# Patient Record
Sex: Female | Born: 1970
Health system: Southern US, Community
[De-identification: ages and names within clinical notes are randomized; demographics above are authoritative.]

## PROBLEM LIST (undated history)

## (undated) DIAGNOSIS — Z8719 Personal history of other diseases of the digestive system: Secondary | ICD-10-CM

## (undated) DIAGNOSIS — Z8742 Personal history of other diseases of the female genital tract: Secondary | ICD-10-CM

## (undated) DIAGNOSIS — N9489 Other specified conditions associated with female genital organs and menstrual cycle: Secondary | ICD-10-CM

## (undated) DIAGNOSIS — F419 Anxiety disorder, unspecified: Secondary | ICD-10-CM

## (undated) DIAGNOSIS — Z8041 Family history of malignant neoplasm of ovary: Secondary | ICD-10-CM

## (undated) DIAGNOSIS — D649 Anemia, unspecified: Secondary | ICD-10-CM

## (undated) DIAGNOSIS — Z803 Family history of malignant neoplasm of breast: Secondary | ICD-10-CM

## (undated) DIAGNOSIS — N739 Female pelvic inflammatory disease, unspecified: Secondary | ICD-10-CM

## (undated) DIAGNOSIS — J302 Other seasonal allergic rhinitis: Secondary | ICD-10-CM

## (undated) DIAGNOSIS — Z800 Family history of malignant neoplasm of digestive organs: Secondary | ICD-10-CM

## (undated) HISTORY — PX: TUBAL LIGATION: SHX77

## (undated) HISTORY — DX: Anxiety disorder, unspecified: F41.9

## (undated) HISTORY — PX: CHOLECYSTECTOMY: SHX55

## (undated) HISTORY — DX: Family history of malignant neoplasm of ovary: Z80.41

## (undated) HISTORY — DX: Family history of malignant neoplasm of breast: Z80.3

## (undated) HISTORY — DX: Family history of malignant neoplasm of digestive organs: Z80.0

## (undated) HISTORY — DX: Personal history of other diseases of the female genital tract: Z87.42

---

## 2011-11-25 HISTORY — PX: ABLATION: SHX5711

## 2014-03-22 ENCOUNTER — Emergency Department
Admission: EM | Admit: 2014-03-22 | Discharge: 2014-03-22 | Disposition: A | Discharge status: Home / Self Care | Payer: BC Managed Care – PPO | Source: Home / Self Care | Attending: Physician Assistant | Admitting: Physician Assistant

## 2014-03-22 ENCOUNTER — Encounter: Payer: Self-pay | Admitting: Emergency Medicine

## 2014-03-22 DIAGNOSIS — J0100 Acute maxillary sinusitis, unspecified: Secondary | ICD-10-CM

## 2014-03-22 DIAGNOSIS — H65192 Other acute nonsuppurative otitis media, left ear: Secondary | ICD-10-CM

## 2014-03-22 HISTORY — DX: Other seasonal allergic rhinitis: J30.2

## 2014-03-22 MED ORDER — AMOXICILLIN-POT CLAVULANATE 875-125 MG PO TABS: 1.0000 | ORAL_TABLET | Freq: Two times a day (BID) | ORAL | Status: DC

## 2014-03-22 NOTE — ED Notes (Signed)
Pt c/o sinus pressure, bilateral ear ache, and nasal congestion x 2 days. Denies fever. She has taken Sudafed, Nyquil and Dayquil.

## 2014-03-22 NOTE — ED Provider Notes (Signed)
CSN: [id number]     Arrival date & time 03/22/14  1209 History   First MD Initiated Contact with Patient 03/22/14 1233     Chief Complaint  Patient presents with  . Nasal Congestion   (Consider location/radiation/quality/duration/timing/severity/associated sxs/prior Treatment) HPI Pt is a 43 yo female who presents to the clinic with sinus pressure, bilateral ear pain left greater than right, nasal congestion for last 2 days. Denies fever or chills. Taken sudafed, dayquil and nyquil with little relief. No other sick contacts.   Past Medical History  Diagnosis Date  . Seasonal allergies    Past Surgical History  Procedure Laterality Date  . Cesarean section    . Cholecystectomy     Family History  Problem Relation Age of Onset  . Cancer Mother     colon  . Heart disease Father    History  Substance Use Topics  . Smoking status: Never Smoker   . Smokeless tobacco: Not on file  . Alcohol Use: Yes   OB History   Grav Para Term Preterm Abortions TAB SAB Ect Mult Living                 Review of Systems  All other systems reviewed and are negative.   Allergies  Review of patient's allergies indicates no known allergies.  Home Medications   Prior to Admission medications   Medication Sig Start Date End Date Taking? Authorizing Provider  fexofenadine (ALLEGRA) 180 MG tablet Take 180 mg by mouth daily.   Yes Historical Provider, MD  amoxicillin-clavulanate (AUGMENTIN) 875-125 MG per tablet Take 1 tablet by mouth 2 (two) times daily. For 10 days. 03/22/14   Refugia Laneve L Evans Levee, PA-C   BP 125/85  Pulse 98  Temp(Src) 97.5 F (36.4 C) (Oral)  Resp 18  Ht 5\' 3"  (1.6 m)  Wt 168 lb (76.204 kg)  BMI 29.77 kg/m2  SpO2 100%  LMP 03/19/2014 Physical Exam  Constitutional: She appears well-developed and well-nourished.  HENT:  Head: Normocephalic and atraumatic.  Nose: Nose normal.  Mouth/Throat: Oropharynx is clear and moist.  Right TM erythematous but able to see TM with  light reflex.  Right TM buldging, erythematous TM. Not able to view ossicles or light reflex.   Maxillary tenderness to palpation over sinuses.   Bilateral nasal turbinates red and swollen with rhinorrhea.   Eyes: Conjunctivae are normal. Right eye exhibits no discharge. Left eye exhibits no discharge.  Neck: Normal range of motion. Neck supple.  Bilateral tender anterior cervical adenopathy.   Cardiovascular: Normal rate, regular rhythm and normal heart sounds.   Pulmonary/Chest: Effort normal and breath sounds normal. She has no wheezes.  Lymphadenopathy:    She has cervical adenopathy.  Psychiatric: She has a normal mood and affect. Her behavior is normal.    ED Course  Procedures (including critical care time) Labs Review Labs Reviewed - No data to display  Imaging Review No results found.   MDM   1. Acute nonsuppurative otitis media of left ear   2. Acute maxillary sinusitis, recurrence not specified    Treated with Augmentin for 10 days.  HO given for symptomatic care.  Discussed to continue mucinex and consider adding flonase.  Follow up if not improving or worsening.     Donella Stade, PA-C 03/22/14 1240

## 2014-03-22 NOTE — Discharge Instructions (Signed)
Sinusitis Sinusitis is redness, soreness, and inflammation of the paranasal sinuses. Paranasal sinuses are air pockets within the bones of your face (beneath the eyes, the middle of the forehead, or above the eyes). In healthy paranasal sinuses, mucus is able to drain out, and air is able to circulate through them by way of your nose. However, when your paranasal sinuses are inflamed, mucus and air can become trapped. This can allow bacteria and other germs to grow and cause infection. Sinusitis can develop quickly and last only a short time (acute) or continue over a long period (chronic). Sinusitis that lasts for more than 12 weeks is considered chronic.  CAUSES  Causes of sinusitis include:  Allergies.  Structural abnormalities, such as displacement of the cartilage that separates your nostrils (deviated septum), which can decrease the air flow through your nose and sinuses and affect sinus drainage.  Functional abnormalities, such as when the small hairs (cilia) that line your sinuses and help remove mucus do not work properly or are not present. SIGNS AND SYMPTOMS  Symptoms of acute and chronic sinusitis are the same. The primary symptoms are pain and pressure around the affected sinuses. Other symptoms include:  Upper toothache.  Earache.  Headache.  Bad breath.  Decreased sense of smell and taste.  A cough, which worsens when you are lying flat.  Fatigue.  Fever.  Thick drainage from your nose, which often is green and may contain pus (purulent).  Swelling and warmth over the affected sinuses. DIAGNOSIS  Your health care provider will perform a physical exam. During the exam, your health care provider may:  Look in your nose for signs of abnormal growths in your nostrils (nasal polyps).  Tap over the affected sinus to check for signs of infection.  View the inside of your sinuses (endoscopy) using an imaging device that has a light attached (endoscope). If your health  care provider suspects that you have chronic sinusitis, one or more of the following tests may be recommended:  Allergy tests.  Nasal culture. A sample of mucus is taken from your nose, sent to a lab, and screened for bacteria.  Nasal cytology. A sample of mucus is taken from your nose and examined by your health care provider to determine if your sinusitis is related to an allergy. TREATMENT  Most cases of acute sinusitis are related to a viral infection and will resolve on their own within 10 days. Sometimes medicines are prescribed to help relieve symptoms (pain medicine, decongestants, nasal steroid sprays, or saline sprays).  However, for sinusitis related to a bacterial infection, your health care provider will prescribe antibiotic medicines. These are medicines that will help kill the bacteria causing the infection.  Rarely, sinusitis is caused by a fungal infection. In theses cases, your health care provider will prescribe antifungal medicine. For some cases of chronic sinusitis, surgery is needed. Generally, these are cases in which sinusitis recurs more than 3 times per year, despite other treatments. HOME CARE INSTRUCTIONS   Drink plenty of water. Water helps thin the mucus so your sinuses can drain more easily.  Use a humidifier.  Inhale steam 3 to 4 times a day (for example, sit in the bathroom with the shower running).  Apply a warm, moist washcloth to your face 3 to 4 times a day, or as directed by your health care provider.  Use saline nasal sprays to help moisten and clean your sinuses.  Take medicines only as directed by your health care provider.    If you were prescribed either an antibiotic or antifungal medicine, finish it all even if you start to feel better. SEEK IMMEDIATE MEDICAL CARE IF:  You have increasing pain or severe headaches.  You have nausea, vomiting, or drowsiness.  You have swelling around your face.  You have vision problems.  You have a stiff  neck.  You have difficulty breathing. MAKE SURE YOU:   Understand these instructions.  Will watch your condition.  Will get help right away if you are not doing well or get worse. Document Released: 05/13/2005 Document Revised: 09/27/2013 Document Reviewed: 05/28/2011 [facility] Patient Information 2015 [place], [place]. This information is not intended to replace advice given to you by your health care provider. Make sure you discuss any questions you have with your health care provider. Otitis Media Otitis media is redness, soreness, and inflammation of the middle ear. Otitis media may be caused by allergies or, most commonly, by infection. Often it occurs as a complication of the common cold. SIGNS AND SYMPTOMS Symptoms of otitis media may include:  Earache.  Fever.  Ringing in your ear.  Headache.  Leakage of fluid from the ear. DIAGNOSIS To diagnose otitis media, your health care provider will examine your ear with an otoscope. This is an instrument that allows your health care provider to see into your ear in order to examine your eardrum. Your health care provider also will ask you questions about your symptoms. TREATMENT  Typically, otitis media resolves on its own within 3-5 days. Your health care provider may prescribe medicine to ease your symptoms of pain. If otitis media does not resolve within 5 days or is recurrent, your health care provider may prescribe antibiotic medicines if he or she suspects that a bacterial infection is the cause. HOME CARE INSTRUCTIONS   If you were prescribed an antibiotic medicine, finish it all even if you start to feel better.  Take medicines only as directed by your health care provider.  Keep all follow-up visits as directed by your health care provider. SEEK MEDICAL CARE IF:  You have otitis media only in one ear, or bleeding from your nose, or both.  You notice a lump on your neck.  You are not getting better in 3-5 days.  You  feel worse instead of better. SEEK IMMEDIATE MEDICAL CARE IF:   You have pain that is not controlled with medicine.  You have swelling, redness, or pain around your ear or stiffness in your neck.  You notice that part of your face is paralyzed.  You notice that the bone behind your ear (mastoid) is tender when you touch it. MAKE SURE YOU:   Understand these instructions.  Will watch your condition.  Will get help right away if you are not doing well or get worse. Document Released: 02/16/2004 Document Revised: 09/27/2013 Document Reviewed: 12/08/2012 [facility] Patient Information 2015 [place], [place]. This information is not intended to replace advice given to you by your health care provider. Make sure you discuss any questions you have with your health care provider.

## 2014-03-27 ENCOUNTER — Telehealth: Payer: Self-pay | Admitting: *Deleted

## 2014-03-27 NOTE — ED Notes (Signed)
Pt called and stated she is still feeling bad and only slightly better after being on antibiotic for 6 days.

## 2014-05-18 ENCOUNTER — Ambulatory Visit (INDEPENDENT_AMBULATORY_CARE_PROVIDER_SITE_OTHER): Payer: Managed Care, Other (non HMO) | Admitting: Internal Medicine

## 2014-05-18 ENCOUNTER — Encounter: Payer: Self-pay | Admitting: Internal Medicine

## 2014-05-18 ENCOUNTER — Encounter: Payer: Self-pay | Admitting: *Deleted

## 2014-05-18 VITALS — BP 121/82 | HR 75 | Resp 16 | Ht 62.5 in | Wt 170.0 lb

## 2014-05-18 DIAGNOSIS — B977 Papillomavirus as the cause of diseases classified elsewhere: Secondary | ICD-10-CM

## 2014-05-18 DIAGNOSIS — Z8041 Family history of malignant neoplasm of ovary: Secondary | ICD-10-CM

## 2014-05-18 DIAGNOSIS — IMO0002 Reserved for concepts with insufficient information to code with codable children: Secondary | ICD-10-CM | POA: Insufficient documentation

## 2014-05-18 DIAGNOSIS — Z800 Family history of malignant neoplasm of digestive organs: Secondary | ICD-10-CM | POA: Insufficient documentation

## 2014-05-18 DIAGNOSIS — F4322 Adjustment disorder with anxiety: Secondary | ICD-10-CM

## 2014-05-18 DIAGNOSIS — F419 Anxiety disorder, unspecified: Secondary | ICD-10-CM

## 2014-05-18 DIAGNOSIS — R888 Abnormal findings in other body fluids and substances: Secondary | ICD-10-CM

## 2014-05-18 DIAGNOSIS — J309 Allergic rhinitis, unspecified: Secondary | ICD-10-CM | POA: Insufficient documentation

## 2014-05-18 DIAGNOSIS — J3089 Other allergic rhinitis: Secondary | ICD-10-CM

## 2014-05-18 DIAGNOSIS — G4700 Insomnia, unspecified: Secondary | ICD-10-CM | POA: Insufficient documentation

## 2014-05-18 DIAGNOSIS — Z803 Family history of malignant neoplasm of breast: Secondary | ICD-10-CM

## 2014-05-18 LAB — COMPREHENSIVE METABOLIC PANEL
ALT: 23 U/L (ref 0–35)
AST: 16 U/L (ref 0–37)
Albumin: 4.5 g/dL (ref 3.5–5.2)
Alkaline Phosphatase: 64 U/L (ref 39–117)
BUN: 10 mg/dL (ref 6–23)
CO2: 26 mEq/L (ref 19–32)
Calcium: 9.2 mg/dL (ref 8.4–10.5)
Chloride: 100 mEq/L (ref 96–112)
Creat: 0.64 mg/dL (ref 0.50–1.10)
Glucose, Bld: 94 mg/dL (ref 70–99)
Potassium: 4.4 mEq/L (ref 3.5–5.3)
Sodium: 137 mEq/L (ref 135–145)
Total Bilirubin: 0.5 mg/dL (ref 0.2–1.2)
Total Protein: 7.3 g/dL (ref 6.0–8.3)

## 2014-05-18 LAB — LIPID PANEL
Cholesterol: 207 mg/dL — ABNORMAL HIGH (ref 0–200)
HDL: 57 mg/dL (ref >39)
LDL Cholesterol: 131 mg/dL — ABNORMAL HIGH (ref 0–99)
Total CHOL/HDL Ratio: 3.6 Ratio
Triglycerides: 96 mg/dL (ref <150)
VLDL: 19 mg/dL (ref 0–40)

## 2014-05-18 LAB — CBC WITH DIFFERENTIAL/PLATELET
Basophils Absolute: 0 10*3/uL (ref 0.0–0.1)
Basophils Relative: 0 % (ref 0–1)
Eosinophils Absolute: 0.1 10*3/uL (ref 0.0–0.7)
Eosinophils Relative: 1 % (ref 0–5)
HCT: 43 % (ref 36.0–46.0)
Hemoglobin: 14.6 g/dL (ref 12.0–15.0)
Lymphocytes Relative: 30 % (ref 12–46)
Lymphs Abs: 2.2 10*3/uL (ref 0.7–4.0)
MCH: 27.9 pg (ref 26.0–34.0)
MCHC: 34 g/dL (ref 30.0–36.0)
MCV: 82.2 fL (ref 78.0–100.0)
MPV: 9.6 fL (ref 9.4–12.4)
Monocytes Absolute: 0.4 10*3/uL (ref 0.1–1.0)
Monocytes Relative: 6 % (ref 3–12)
Neutro Abs: 4.5 10*3/uL (ref 1.7–7.7)
Neutrophils Relative %: 63 % (ref 43–77)
Platelets: 251 10*3/uL (ref 150–400)
RBC: 5.23 MIL/uL — ABNORMAL HIGH (ref 3.87–5.11)
RDW: 13.4 % (ref 11.5–15.5)
WBC: 7.2 10*3/uL (ref 4.0–10.5)

## 2014-05-18 MED ORDER — TRAZODONE HCL 100 MG PO TABS: ORAL_TABLET | ORAL | Status: DC

## 2014-05-18 NOTE — Progress Notes (Signed)
   Subjective:    Patient ID: Megan Gibson, female    DOB: 05/15/1971, 43 y.o.   MRN: [id number]  HPI  Megan Gibson is a new pt here for first visit .   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  Allergic rhinitis, and intermittant vertigo that she tells me is stress induced  No vertigo now   Strong FH of premenopausal breast CA age 63 in mother. Ovarian cancer mother age 1 and colon cancer in mother.  No other gyn CA's .    Megan Gibson reports increasing anxiety and insomnia.   Biggest stress right now is her work.  Hard to shut off work worries   Has difficulty sleeping dur to work issues   Review of Systems See HPI    Objective:   Physical Exam  Physical Exam  Nursing note and vitals reviewed.  Constitutional: She is oriented to person, place, and time. She appears well-developed and well-nourished.  HENT:  Head: Normocephalic and atraumatic.  Cardiovascular: Normal rate and regular rhythm. Exam reveals no gallop and no friction rub.  No murmur heard.  Pulmonary/Chest: Breath sounds normal. She has no wheezes. She has no rales.  Neurological: She is alert and oriented to person, place, and time.  Skin: Skin is warm and dry.  Psychiatric: She has a normal mood and affect. Her behavior is normal.            Assessment & Plan:  Insomnia  Related to anxiety  Will give Trazodone 50mg  for one week then 100 mg nightly  Anxiety   Does not want anything other than trazodone now.  Declines therapist now  Strong FH premenopausal breast CA in mother , ovarian ca and colon   Suspect genetic cancer syndrome  Will refer to genetic counselor.  She has had 2 mm (neg) and had colonoscopy Dr. Alonza Gibson  HPV pos  Managed by GYN  Allergic rhinitis    To lab today  See me 4-6 weeks schedule CPE

## 2014-05-18 NOTE — Patient Instructions (Signed)
See me 5-6 weeks    Schedule CPe

## 2014-05-19 LAB — VITAMIN D 25 HYDROXY (VIT D DEFICIENCY, FRACTURES): Vit D, 25-Hydroxy: 15 ng/mL — ABNORMAL LOW (ref 30–100)

## 2014-05-19 LAB — TSH: TSH: 0.769 u[IU]/mL (ref 0.350–4.500)

## 2014-05-25 ENCOUNTER — Telehealth: Payer: Self-pay | Admitting: *Deleted

## 2014-05-25 NOTE — Telephone Encounter (Signed)
Received referral from Dr. Sharin Mons office for genetics.  Called pt and confirmed 06/01/14 genetic appt w/ her.  Emailed Dr. Coralyn Mark to make her aware.

## 2014-05-29 ENCOUNTER — Other Ambulatory Visit: Payer: Self-pay | Admitting: Internal Medicine

## 2014-05-29 MED ORDER — VITAMIN D (ERGOCALCIFEROL) 1.25 MG (50000 UNIT) PO CAPS: 50000.0000 [IU] | ORAL_CAPSULE | ORAL | Status: DC

## 2014-05-30 ENCOUNTER — Telehealth: Payer: Self-pay

## 2014-05-30 ENCOUNTER — Telehealth: Payer: Self-pay | Admitting: *Deleted

## 2014-05-30 NOTE — Telephone Encounter (Signed)
Megan Gibson [phone]  Megan Gibson returned your call

## 2014-05-30 NOTE — Telephone Encounter (Signed)
I spoke with Megan Gibson for and gave her lab results

## 2014-05-30 NOTE — Telephone Encounter (Signed)
-----   Message from Lanice Shirts, MD sent at 05/29/2014  4:23 PM EST ----- Call pt and let her know that her vitamin D is very low  I e-scribed 50,000 units of vitamin D to take once a week for 12 weeks then [phone] units over the counter daily   Ok to mail to her   Rx sent downstairs

## 2014-05-30 NOTE — Telephone Encounter (Signed)
I left Sophina another message

## 2014-06-01 ENCOUNTER — Other Ambulatory Visit: Payer: Managed Care, Other (non HMO)

## 2014-06-01 ENCOUNTER — Ambulatory Visit (HOSPITAL_BASED_OUTPATIENT_CLINIC_OR_DEPARTMENT_OTHER): Payer: Managed Care, Other (non HMO) | Admitting: Genetic Counselor

## 2014-06-01 ENCOUNTER — Encounter: Payer: Self-pay | Admitting: Genetic Counselor

## 2014-06-01 DIAGNOSIS — Z315 Encounter for genetic counseling: Secondary | ICD-10-CM

## 2014-06-01 DIAGNOSIS — Z8041 Family history of malignant neoplasm of ovary: Secondary | ICD-10-CM | POA: Insufficient documentation

## 2014-06-01 DIAGNOSIS — Z800 Family history of malignant neoplasm of digestive organs: Secondary | ICD-10-CM | POA: Insufficient documentation

## 2014-06-01 DIAGNOSIS — Z803 Family history of malignant neoplasm of breast: Secondary | ICD-10-CM | POA: Insufficient documentation

## 2014-06-01 NOTE — Progress Notes (Signed)
REFERRING PROVIDER: Lanice Shirts, MD [address] [place], [place] [address]  PRIMARY PROVIDER:  SCHOENHOFF,DEBBIE, MD  PRIMARY REASON FOR VISIT:  1. Family history of breast cancer   2. Family history of ovarian cancer   3. Family history of pancreatic cancer   4. Family history of colon cancer      HISTORY OF PRESENT ILLNESS:   Ms. Latona, a 44 y.o. female, was seen for a [place] cancer genetics consultation at the request of Dr. Coralyn Mark due to a family history of cancer.  Ms. Missey presents to clinic today to discuss the possibility of a hereditary predisposition to cancer, genetic testing, and to further clarify her future cancer risks, as well as potential cancer risks for family members.   CANCER HISTORY:   No history exists.     HISTORY OF PRESENT ILLNESS: Ms. Coventry is a 44 y.o. female with no personal history of cancer.  Her mother was diagnosed with multiple cancers, including breast, ovarian and colon cancer - all under the age of 31.  She also has a significant family history of cancer, which has prompted early screening for colon cancer and mammograms.  Ms. Winiecki comes to learn more about genetic testing options.  HORMONAL RISK FACTORS:  Menarche was at age 87-12.  First live birth at age 22.  OCP use for approximately 15 years.  Ovaries intact: yes.  Hysterectomy: no.  Menopausal status: premenopausal.  HRT use: 0 years. Colonoscopy: yes; normal. Mammogram within the last year: yes. Number of breast biopsies: 0. Up to date with pelvic exams:  yes. Any excessive radiation exposure in the past:  no  Past Medical History  Diagnosis Date  . Seasonal allergies   . Anxiety   . History of abnormal cervical Pap smear   . Family history of breast cancer in mother   . Family history of ovarian cancer   . Family history of breast cancer in mother   . Family history of colon cancer in mother   . Family history of pancreatic cancer      Past Surgical History  Procedure Laterality Date  . Cesarean section    . Cholecystectomy    . Ablation  11/2011    History   Social History  . Marital Status: Married    Spouse Name: N/A    Number of Children: 2  . Years of Education: N/A   Social History Main Topics  . Smoking status: Never Smoker   . Smokeless tobacco: Never Used  . Alcohol Use: 0.0 oz/week    0 Not specified per week     Comment: 5/month  . Drug Use: No  . Sexual Activity:    Partners: Male    Birth Control/ Protection: Surgical   Other Topics Concern  . None   Social History Narrative     FAMILY HISTORY:  We obtained a detailed, 4-generation family history.  Significant diagnoses are listed below: Family History  Problem Relation Age of Onset  . Breast cancer Mother     dx in her 30s  . Ovarian cancer Mother 4  . Colon cancer Mother 71  . Heart disease Father   . Diabetes Father   . Diabetes Maternal Grandmother   . Heart disease Maternal Grandmother   . Pancreatic cancer Maternal Grandfather     dx and died in his 68s  . Ovarian cancer Maternal Aunt     dx in her 49s  . Lung cancer  Maternal Uncle   . Multiple sclerosis Maternal Aunt   . Prostate cancer Maternal Uncle     metastized to liver  . Breast cancer Cousin     maternal first cousin dx in her 95s  . Cancer Cousin     maternal first cousin dx with cancer NOS, causing her to have a TAH-BSO in her 34s   Ms. Vazques is an only child, as her mother had ovarian cancer a year after she was born.  Her mother was the youngest of 45 siblings.  Two siblings died in their teens from a MVA, one brother had prostate cancer with mets to his liver, two brothers had lung cancer (one was a smoker), one sister had ovarian cancer in her 78s, one sister has multiple sclerosis, and two siblings are living and have no cancer.  Ms. Gatz has one maternal cousin with breast cancer in her 51s and another cousin who had cancer and resulted in her  cousin having a TAH-BSO.  The cancer on her father's side of the family is non-contributory (one uncle had lung cancer. He was a smoker).  Patient's maternal ancestors are of [place] descent, and paternal ancestors are of [place] and Caucasian descent. There is no reported Ashkenazi Jewish ancestry. There is no known consanguinity.  GENETIC COUNSELING ASSESSMENT: Yancy Hascall is a 44 y.o. female with a family history of cancer which somewhat suggestive of a HBOC or Lynch syndrome and predisposition to cancer. We, therefore, discussed and recommended the following at today's visit.   DISCUSSION: We reviewed the characteristics, features and inheritance patterns of hereditary cancer syndromes. We reviewed the most common causes of hereditary breast, ovarian and colon cancer, including BRCA mutations and Lynch syndrome.  We also discussed genetic testing, including the appropriate family members to test, the process of testing, insurance coverage and turn-around-time for results. We discussed the implications of a negative, positive and/or variant of uncertain significant result.   Based on Ms. Parham's family history, we recommended her maternal aunt with ovarian cancer or her maternal cousin with breast cancer in her 57s, have genetic counseling and testing. Ms. Amir feels that neither one of them has had cancer and probably will not undergo testing.  She will reach out to her cousin, however, as she is in contact with her to see if she is willing to consider it.  In order to estimate her chance of having a BRCA mutation, we used statistical models (Tyrer [place], [employer] and Myriad Risk calculator) and laboratory data that take into account her personal medical history, family history and ancestry.  Because each model is different, there can be a lot of variability in the risks they give.  Therefore, these numbers must be considered a rough range and not a precise risk of having a BRCA mutation.  These  models estimate that she has approximately a 7.2-40% chance of having a mutation. Based on this assessment of her family and personal history, genetic testing is recommended.  Based on the patient's personal and family history, statistical models (Tyrer Cusik and [employer])  and literature data were used to estimate her risk of developing breast cancer. These estimate her lifetime risk of developing breast cancer to be approximately 18.6% to 32%. This estimation does not take into account any genetic testing results.  The patient's lifetime breast cancer risk is a preliminary estimate based on available information using one of several models endorsed by the [place] (ACS). The ACS recommends consideration of  breast MRI screening as an adjunct to mammography for patients at high risk (defined as 20% or greater lifetime risk). A more detailed breast cancer risk assessment can be considered, if clinically indicated.   PLAN: After considering the risks, benefits, and limitations,Ms. Chipley  provided informed consent to pursue genetic testing and the blood sample was sent to [facility] for analysis of the CancerNext Panel test. The CancerNext panel test performs sequencing and deletion/duplication testing of the following 32 genes:  APC, ATM, BARD1, BMPR1A, BRCA1, BRCA2, BRIP1, CDH1, CDK4, CDKN2A, CHEK2, EPCAM, GREM1, MLH1, MRE11A, MSH2, MSH6, MUTYH, NBN, NF1, PALB2, PMS2, POLD1, POLE, PTEN, RAD50, RAD51D, SMAD4, SMARCA4, STK11, and TP53. Results should be available within approximately 3-4 weeks' time, at which point they will be disclosed by telephone to Ms. Meuser, as will any additional recommendations warranted by these results. Ms. Sitton will receive a summary of her genetic counseling visit and a copy of her results once available. This information will also be available in Epic. We encouraged Ms. Mignone to remain in contact with cancer genetics annually so that we can  continuously update the family history and inform her of any changes in cancer genetics and testing that may be of benefit for her family. Ms. Lavin questions were answered to her satisfaction today. Our contact information was provided should additional questions or concerns arise.  Lastly, we encouraged Ms. Dawn to remain in contact with cancer genetics annually so that we can continuously update the family history and inform her of any changes in cancer genetics and testing that may be of benefit for this family.   Ms.  Tedeschi questions were answered to her satisfaction today. Our contact information was provided should additional questions or concerns arise. Thank you for the referral and allowing [place] to share in the care of your patient.   Juanmanuel Marohl P. Florene Glen, [place], [facility] Certified Genetic Counselor [email or web address] phone: [phone]  The patient was seen for a total of 60 minutes in face-to-face genetic counseling.  This patient was discussed with Drs. Magrinat, Lindi Adie and/or Burr Medico who agrees with the above.    _______________________________________________________________________ For Office Staff:  Number of people involved in session: 1 Was an Intern/ student involved with case: no

## 2014-06-02 ENCOUNTER — Other Ambulatory Visit: Payer: Self-pay | Admitting: *Deleted

## 2014-06-02 MED ORDER — FLUTICASONE PROPIONATE 50 MCG/ACT NA SUSP: 2.0000 | Freq: Every day | NASAL | Status: DC

## 2014-06-02 NOTE — Telephone Encounter (Signed)
Refill request

## 2014-06-03 ENCOUNTER — Telehealth: Payer: Self-pay

## 2014-06-06 NOTE — Telephone Encounter (Signed)
error 

## 2014-06-07 ENCOUNTER — Encounter: Payer: Self-pay | Admitting: Internal Medicine

## 2014-06-22 ENCOUNTER — Encounter: Payer: Self-pay | Admitting: Internal Medicine

## 2014-06-22 ENCOUNTER — Ambulatory Visit (INDEPENDENT_AMBULATORY_CARE_PROVIDER_SITE_OTHER): Payer: Federal, State, Local not specified - PPO | Admitting: Internal Medicine

## 2014-06-22 VITALS — BP 118/85 | HR 83 | Resp 16 | Ht 62.5 in | Wt 173.0 lb

## 2014-06-22 DIAGNOSIS — F4322 Adjustment disorder with anxiety: Secondary | ICD-10-CM

## 2014-06-22 DIAGNOSIS — G4700 Insomnia, unspecified: Secondary | ICD-10-CM

## 2014-06-22 NOTE — Progress Notes (Signed)
Subjective:    Patient ID: Megan Gibson, female    DOB: 10-22-1970, 44 y.o.   MRN: [id number]  HPI 04/2014  Insomnia Related to anxiety Will give Trazodone 50mg  for one week then 100 mg nightly  Anxiety Does not want anything other than trazodone now. Declines therapist now  Strong FH premenopausal breast CA in mother , ovarian ca and colon Suspect genetic cancer syndrome Will refer to genetic counselor. She has had 2 mm (neg) and had colonoscopy Dr. Alonza Bogus  HPV pos Managed by GYN  Allergic rhinitis   To lab today See me 4-6 weeks schedule CPE          TODAY  Megan Gibson is here for follow up  States she could not tolerate Trazodone 100 mg.  So she is taking 50 mg but still is not controlling her anxiety    Waking at night and cannot "shut back down"   No Known Allergies Past Medical History  Diagnosis Date  . Seasonal allergies   . Anxiety   . History of abnormal cervical Pap smear   . Family history of breast cancer in mother   . Family history of ovarian cancer   . Family history of breast cancer in mother   . Family history of colon cancer in mother   . Family history of pancreatic cancer    Past Surgical History  Procedure Laterality Date  . Cesarean section    . Cholecystectomy    . Ablation  11/2011   History   Social History  . Marital Status: Married    Spouse Name: N/A    Number of Children: 2  . Years of Education: N/A   Occupational History  . Not on file.   Social History Main Topics  . Smoking status: Never Smoker   . Smokeless tobacco: Never Used  . Alcohol Use: 0.0 oz/week    0 Not specified per week     Comment: 5/month  . Drug Use: No  . Sexual Activity:    Partners: Male    Birth Control/ Protection: Surgical   Other Topics Concern  . Not on file   Social History Narrative   Family History  Problem Relation Age of Onset  . Breast cancer Mother     dx in her 12s  . Ovarian cancer Mother 63  . Colon cancer  Mother 43  . Heart disease Father   . Diabetes Father   . Diabetes Maternal Grandmother   . Heart disease Maternal Grandmother   . Pancreatic cancer Maternal Grandfather     dx and died in his 52s  . Ovarian cancer Maternal Aunt     dx in her 42s  . Lung cancer Maternal Uncle   . Multiple sclerosis Maternal Aunt   . Prostate cancer Maternal Uncle     metastized to liver  . Breast cancer Cousin     maternal first cousin dx in her 62s  . Cancer Cousin     maternal first cousin dx with cancer NOS, causing her to have a TAH-BSO in her 80s   Patient Active Problem List   Diagnosis Date Noted  . Family history of ovarian cancer   . Family history of breast cancer in mother   . Family history of colon cancer in mother   . Family history of pancreatic cancer   . Anxiety 05/18/2014  . HPV test positive 05/18/2014  . Insomnia 05/18/2014  . Allergic rhinitis 05/18/2014  . FHx: breast cancer  in first degree relative  mother age 28 05/18/2014  . FH: ovarian cancer in first degree relative  mother 05/18/2014  . FH: colon cancer in relative <64 years old  mother age 21's 05/18/2014   Current Outpatient Prescriptions on File Prior to Visit  Medication Sig Dispense Refill  . fexofenadine (ALLEGRA) 180 MG tablet Take 180 mg by mouth daily.    . fluticasone (FLONASE) 50 MCG/ACT nasal spray Place 2 sprays into both nostrils daily. 16 g 6  . traZODone (DESYREL) 100 MG tablet Take 1/2 at hs for 10 days then 1 whole tablet hs 30 tablet 1  . Vitamin D, Ergocalciferol, (DRISDOL) 50000 UNITS CAPS capsule Take 1 capsule (50,000 Units total) by mouth every 7 (seven) days. 12 capsule 0   No current facility-administered medications on file prior to visit.       Review of Systems    see HPI Objective:   Physical Exam Physical Exam  Nursing note and vitals reviewed.  Constitutional: She is oriented to person, place, and time. She appears well-developed and well-nourished.  HENT:  Head:  Normocephalic and atraumatic.  Cardiovascular: Normal rate and regular rhythm. Exam reveals no gallop and no friction rub.  No murmur heard.  Pulmonary/Chest: Breath sounds normal. She has no wheezes. She has no rales.  Neurological: She is alert and oriented to person, place, and time.  Skin: Skin is warm and dry.  Psychiatric: She has a normal mood and affect. Her behavior is normal.              Assessment & Plan:  Anxiety  I advised to see a psychiatrist to get a working diagnosis    Gave pt number to Dr. Rowe Pavy and Dr. Toy Care  She will call  Insomnia related to above  Will continue low dose trazodone

## 2014-06-22 NOTE — Patient Instructions (Signed)
Dr. Caprice Beaver /  Dr. Lita Mains  [phone]  Dr.  Toy Care [phone]

## 2014-06-29 ENCOUNTER — Telehealth: Payer: Self-pay | Admitting: Genetic Counselor

## 2014-06-29 NOTE — Telephone Encounter (Signed)
LM on VM with good news regarding genetic test results.  Asked that she call back.

## 2014-06-29 NOTE — Telephone Encounter (Signed)
Revealed negative genetic test results on [facility] panel testing.  Discussed trying to get her maternal aunt tested to determine if Chelsie is a true negative.

## 2014-09-21 ENCOUNTER — Encounter: Payer: Managed Care, Other (non HMO) | Admitting: Internal Medicine

## 2014-10-05 ENCOUNTER — Ambulatory Visit (INDEPENDENT_AMBULATORY_CARE_PROVIDER_SITE_OTHER): Payer: Federal, State, Local not specified - PPO | Admitting: Internal Medicine

## 2014-10-05 ENCOUNTER — Encounter: Payer: Self-pay | Admitting: *Deleted

## 2014-10-05 ENCOUNTER — Encounter: Payer: Self-pay | Admitting: Internal Medicine

## 2014-10-05 VITALS — BP 123/85 | HR 89 | Resp 16 | Ht 63.0 in | Wt 172.0 lb

## 2014-10-05 DIAGNOSIS — R888 Abnormal findings in other body fluids and substances: Secondary | ICD-10-CM

## 2014-10-05 DIAGNOSIS — B977 Papillomavirus as the cause of diseases classified elsewhere: Secondary | ICD-10-CM

## 2014-10-05 DIAGNOSIS — Z0001 Encounter for general adult medical examination with abnormal findings: Secondary | ICD-10-CM | POA: Diagnosis not present

## 2014-10-05 DIAGNOSIS — Z1211 Encounter for screening for malignant neoplasm of colon: Secondary | ICD-10-CM | POA: Diagnosis not present

## 2014-10-05 DIAGNOSIS — G4700 Insomnia, unspecified: Secondary | ICD-10-CM | POA: Diagnosis not present

## 2014-10-05 DIAGNOSIS — Z800 Family history of malignant neoplasm of digestive organs: Secondary | ICD-10-CM

## 2014-10-05 DIAGNOSIS — Z0000 Encounter for general adult medical examination without abnormal findings: Secondary | ICD-10-CM

## 2014-10-05 DIAGNOSIS — F4322 Adjustment disorder with anxiety: Secondary | ICD-10-CM | POA: Diagnosis not present

## 2014-10-05 DIAGNOSIS — E559 Vitamin D deficiency, unspecified: Secondary | ICD-10-CM

## 2014-10-05 DIAGNOSIS — IMO0002 Reserved for concepts with insufficient information to code with codable children: Secondary | ICD-10-CM

## 2014-10-05 LAB — HEMOCCULT GUIAC POC 1CARD (OFFICE)
Card #1 Date: 5112016
Fecal Occult Blood, POC: NEGATIVE

## 2014-10-05 LAB — POCT URINALYSIS DIPSTICK
Bilirubin, UA: NEGATIVE
Blood, UA: NEGATIVE
Glucose, UA: NEGATIVE
Ketones, UA: NEGATIVE
Leukocytes, UA: NEGATIVE
Nitrite, UA: NEGATIVE
Protein, UA: NEGATIVE
Spec Grav, UA: 1.01
Urobilinogen, UA: NEGATIVE
pH, UA: 6

## 2014-10-05 LAB — LIPID PANEL
Cholesterol: 172 mg/dL (ref 0–200)
HDL: 59 mg/dL (ref >=46)
LDL Cholesterol: 92 mg/dL (ref 0–99)
Total CHOL/HDL Ratio: 2.9 Ratio
Triglycerides: 103 mg/dL (ref <150)
VLDL: 21 mg/dL (ref 0–40)

## 2014-10-05 MED ORDER — TRAZODONE HCL 100 MG PO TABS: ORAL_TABLET | ORAL | Status: DC

## 2014-10-05 MED ORDER — FLUTICASONE PROPIONATE 50 MCG/ACT NA SUSP: NASAL | Status: AC

## 2014-10-05 NOTE — Progress Notes (Signed)
Subjective:    Patient ID: Megan Gibson, female    DOB: Oct 20, 1970, 44 y.o.   MRN: [id number]  HPI 05/18/2014 note  Assessment & Plan:  Insomnia Related to anxiety Will give Trazodone 50mg  for one week then 100 mg nightly  Anxiety Does not want anything other than trazodone now. Declines therapist now  Strong FH premenopausal breast CA in mother , ovarian ca and colon Suspect genetic cancer syndrome Will refer to genetic counselor. She has had 2 mm (neg) and had colonoscopy Dr. Alonza Bogus  HPV pos Managed by GYN  Allergic rhinitis        TODAY  Megan Gibson is here for CPE  HM mm done 01/2014,  Colonoscopy done 2013,  Pap 2016 per gyn  She is a non-smoker  No Known Allergies Past Medical History  Diagnosis Date  . Seasonal allergies   . Anxiety   . History of abnormal cervical Pap smear   . Family history of breast cancer in mother   . Family history of ovarian cancer   . Family history of breast cancer in mother   . Family history of colon cancer in mother   . Family history of pancreatic cancer    Past Surgical History  Procedure Laterality Date  . Cesarean section    . Cholecystectomy    . Ablation  11/2011   History   Social History  . Marital Status: Married    Spouse Name: N/A  . Number of Children: 2  . Years of Education: N/A   Occupational History  . Not on file.   Social History Main Topics  . Smoking status: Never Smoker   . Smokeless tobacco: Never Used  . Alcohol Use: 0.0 oz/week    0 Standard drinks or equivalent per week     Comment: 5/month  . Drug Use: No  . Sexual Activity:    Partners: Male    Birth Control/ Protection: Surgical   Other Topics Concern  . Not on file   Social History Narrative   Family History  Problem Relation Age of Onset  . Breast cancer Mother     dx in her 6s  . Ovarian cancer Mother 30  . Colon cancer Mother 6  . Heart disease Father   . Diabetes Father   . Diabetes Maternal Grandmother     . Heart disease Maternal Grandmother   . Pancreatic cancer Maternal Grandfather     dx and died in his 62s  . Ovarian cancer Maternal Aunt     dx in her 24s  . Lung cancer Maternal Uncle   . Multiple sclerosis Maternal Aunt   . Prostate cancer Maternal Uncle     metastized to liver  . Breast cancer Cousin     maternal first cousin dx in her 25s  . Cancer Cousin     maternal first cousin dx with cancer NOS, causing her to have a TAH-BSO in her 32s   Patient Active Problem List   Diagnosis Date Noted  . Family history of ovarian cancer   . Family history of breast cancer in mother   . Family history of colon cancer in mother   . Family history of pancreatic cancer   . Anxiety 05/18/2014  . HPV test positive 05/18/2014  . Insomnia 05/18/2014  . Allergic rhinitis 05/18/2014  . FHx: breast cancer in first degree relative  mother age 22 05/18/2014  . FH: ovarian cancer in first degree relative  mother 05/18/2014  .  FH: colon cancer in relative <42 years old  mother age 37's 05/18/2014   Current Outpatient Prescriptions on File Prior to Visit  Medication Sig Dispense Refill  . fexofenadine (ALLEGRA) 180 MG tablet Take 180 mg by mouth daily.    . fluticasone (FLONASE) 50 MCG/ACT nasal spray Place 2 sprays into both nostrils daily. 16 g 6  . traZODone (DESYREL) 100 MG tablet Take 1/2 at hs for 10 days then 1 whole tablet hs 30 tablet 1  . Vitamin D, Ergocalciferol, (DRISDOL) 50000 UNITS CAPS capsule Take 1 capsule (50,000 Units total) by mouth every 7 (seven) days. 12 capsule 0   No current facility-administered medications on file prior to visit.       Review of Systems See HPI    Objective:   Physical Exam Physical Exam  Nursing note and vitals reviewed.  Constitutional: She is oriented to person, place, and time. She appears well-developed and well-nourished.  HENT:  Head: Normocephalic and atraumatic.  Right Ear: Tympanic membrane and ear canal normal. No drainage.  Tympanic membrane is not injected and not erythematous.  Left Ear: Tympanic membrane and ear canal normal. No drainage. Tympanic membrane is not injected and not erythematous.  Nose: Nose normal. Right sinus exhibits no maxillary sinus tenderness and no frontal sinus tenderness. Left sinus exhibits no maxillary sinus tenderness and no frontal sinus tenderness.  Mouth/Throat: Oropharynx is clear and moist. No oral lesions. No oropharyngeal exudate.  Eyes: Conjunctivae and EOM are normal. Pupils are equal, round, and reactive to light.  Neck: Normal range of motion. Neck supple. No JVD present. Carotid bruit is not present. No mass and no thyromegaly present.  Cardiovascular: Normal rate, regular rhythm, S1 normal, S2 normal and intact distal pulses. Exam reveals no gallop and no friction rub.  No murmur heard.  Pulses:  Carotid pulses are 2+ on the right side, and 2+ on the left side.  Dorsalis pedis pulses are 2+ on the right side, and 2+ on the left side.  No carotid bruit. No LE edema  Pulmonary/Chest: Breath sounds normal. She has no wheezes. She has no rales. She exhibits no tenderness.  Breast no discrete mass no nipple discharge no axillary adenopathy bilaterally Abdominal: Soft. Bowel sounds are normal. She exhibits no distension and no mass. There is no hepatosplenomegaly. There is no tenderness. There is no CVA tenderness.  Rectal no mass guaiac neg Musculoskeletal: Normal range of motion.  No active synovitis to joints.  Lymphadenopathy:  She has no cervical adenopathy.  She has no axillary adenopathy.  Right: No inguinal and no supraclavicular adenopathy present.  Left: No inguinal and no supraclavicular adenopathy present.  Neurological: She is alert and oriented to person, place, and time. She has normal strength and normal reflexes. She displays no tremor. No cranial nerve deficit or sensory deficit. Coordination and gait normal.  Skin: Skin is warm and dry. No rash noted. No  cyanosis. Nails show no clubbing.  Psychiatric: She has a normal mood and affect. Her speech is normal and behavior is normal. Cognition and memory are normal.           g    Assessment & Plan:  HM  HM mm done 01/2014,  Colonoscopy done 2013,  Pap 2016 per gyn  She is a non-smoker  Hyperlipidemia  Recheck today DASH diet given  Allergic rhinitis   Ok for flonase  Adjustment disorder with anxiety She does not wish meds or therapist now  Insomnia related to above  Ok to continue trazodone prn  HPV positive  Managed by Or[facility] Pyo FH of breast, colon CA  hemocult neg today  Has earlier colonoscopy screening due 2018  [facility]   Pt received letter of my departure advised to make new pt appt with PCP of choice

## 2014-10-06 ENCOUNTER — Telehealth: Payer: Self-pay | Admitting: *Deleted

## 2014-10-06 LAB — VITAMIN D 25 HYDROXY (VIT D DEFICIENCY, FRACTURES): Vit D, 25-Hydroxy: 18 ng/mL — ABNORMAL LOW (ref 30–100)

## 2014-10-06 NOTE — Telephone Encounter (Signed)
I spoke with Megan Gibson about her recent lab results. I also mailed hear copy-eh

## 2014-10-06 NOTE — Telephone Encounter (Signed)
-----   Message from Lanice Shirts, MD sent at 10/06/2014 10:56 AM EDT ----- Call and let pt know her vitamin D is a little better  ADvise her to get 5000 units over the counter of D3 and take twice a week.  Will give labs to you to mail

## 2014-11-29 ENCOUNTER — Encounter: Payer: Self-pay | Admitting: *Deleted

## 2014-11-29 ENCOUNTER — Emergency Department (INDEPENDENT_AMBULATORY_CARE_PROVIDER_SITE_OTHER)
Admission: EM | Admit: 2014-11-29 | Discharge: 2014-11-29 | Disposition: A | Discharge status: Home / Self Care | Payer: Federal, State, Local not specified - PPO | Source: Home / Self Care

## 2014-11-29 DIAGNOSIS — Z23 Encounter for immunization: Secondary | ICD-10-CM

## 2014-11-29 MED ORDER — TETANUS-DIPHTH-ACELL PERTUSSIS 5-2.5-18.5 LF-MCG/0.5 IM SUSP
0.5000 mL | Freq: Once | INTRAMUSCULAR | Status: AC
  Administered 2014-11-29: 0.5 mL via INTRAMUSCULAR

## 2014-11-29 NOTE — ED Notes (Signed)
Megan Gibson is here for TDaP injection post injury while gardening.

## 2015-04-18 ENCOUNTER — Encounter: Payer: Self-pay | Admitting: Emergency Medicine

## 2015-04-18 ENCOUNTER — Emergency Department
Admission: EM | Admit: 2015-04-18 | Discharge: 2015-04-18 | Disposition: A | Discharge status: Home / Self Care | Payer: Federal, State, Local not specified - PPO | Source: Home / Self Care | Attending: Family Medicine | Admitting: Family Medicine

## 2015-04-18 DIAGNOSIS — J019 Acute sinusitis, unspecified: Secondary | ICD-10-CM | POA: Diagnosis not present

## 2015-04-18 DIAGNOSIS — J029 Acute pharyngitis, unspecified: Secondary | ICD-10-CM

## 2015-04-18 LAB — POCT RAPID STREP A (OFFICE): Rapid Strep A Screen: NEGATIVE

## 2015-04-18 MED ORDER — AMOXICILLIN-POT CLAVULANATE 875-125 MG PO TABS: 1.0000 | ORAL_TABLET | Freq: Two times a day (BID) | ORAL | Status: DC

## 2015-04-18 NOTE — ED Provider Notes (Signed)
CSN: [id number]     Arrival date & time 04/18/15  [id number] History   None    Chief Complaint  Patient presents with  . Sore Throat   (Consider location/radiation/quality/duration/timing/severity/associated sxs/prior Treatment) HPI  Pt is a 44yo female presenting to [facility] with c/o sinus congestion that has gradually worsened over the last 6 days, associated post-nasal drip that keeps her up at night due to a sore throat and mild intermittent frontal headaches.  She has tried OTC medications including Dayquil and Sudafed w/o relief.  Her children were sick with similar symptoms about 2-3 weeks ago.  Pt reports subjective fever. Denies n/v/d. Denies chest pain or shortness of breath.   Past Medical History  Diagnosis Date  . Seasonal allergies   . Anxiety   . History of abnormal cervical Pap smear   . Family history of breast cancer in mother   . Family history of ovarian cancer   . Family history of breast cancer in mother   . Family history of colon cancer in mother   . Family history of pancreatic cancer    Past Surgical History  Procedure Laterality Date  . Cesarean section    . Cholecystectomy    . Ablation  11/2011   Family History  Problem Relation Age of Onset  . Breast cancer Mother     dx in her 92s  . Ovarian cancer Mother 71  . Colon cancer Mother 35  . Heart disease Father   . Diabetes Father   . Diabetes Maternal Grandmother   . Heart disease Maternal Grandmother   . Pancreatic cancer Maternal Grandfather     dx and died in his 20s  . Ovarian cancer Maternal Aunt     dx in her 97s  . Lung cancer Maternal Uncle   . Multiple sclerosis Maternal Aunt   . Prostate cancer Maternal Uncle     metastized to liver  . Breast cancer Cousin     maternal first cousin dx in her 87s  . Cancer Cousin     maternal first cousin dx with cancer NOS, causing her to have a TAH-BSO in her 110s   Social History  Substance Use Topics  . Smoking status: Never Smoker   . Smokeless  tobacco: Never Used  . Alcohol Use: 0.0 oz/week    0 Standard drinks or equivalent per week     Comment: 5/month   OB History    No data available     Review of Systems  Constitutional: Positive for fever and chills.  HENT: Positive for congestion, postnasal drip, rhinorrhea, sinus pressure and sore throat. Negative for ear pain, trouble swallowing and voice change.   Respiratory: Positive for cough. Negative for shortness of breath.   Cardiovascular: Negative for chest pain and palpitations.  Gastrointestinal: Negative for nausea, vomiting, abdominal pain and diarrhea.  Musculoskeletal: Negative for myalgias, back pain and arthralgias.  Skin: Negative for rash.  Neurological: Positive for headaches. Negative for dizziness and light-headedness.    Allergies  Review of patient's allergies indicates not on file.  Home Medications   Prior to Admission medications   Medication Sig Start Date End Date Taking? Authorizing Provider  amoxicillin-clavulanate (AUGMENTIN) 875-125 MG tablet Take 1 tablet by mouth 2 (two) times daily. One po bid x 7 days 04/18/15   Noland Fordyce, PA-C  fexofenadine (ALLEGRA) 180 MG tablet Take 180 mg by mouth daily.    Historical Provider, MD  fluticasone (FLONASE) 50 MCG/ACT nasal spray One spray  nostril daily for 2 weeks then stop   Give generic 10/05/14   Lanice Shirts, MD  traZODone (DESYREL) 100 MG tablet Take one tablet hs prn sleep 10/05/14   Lanice Shirts, MD   Meds Ordered and Administered this Visit  Medications - No data to display  BP 126/86 mmHg  Pulse 87  Temp(Src) 98.4 F (36.9 C) (Oral)  Ht 5\' 2"  (1.575 m)  Wt 161 lb (73.029 kg)  BMI 29.44 kg/m2  SpO2 100% No data found.   Physical Exam  Constitutional: She appears well-developed and well-nourished. No distress.  HENT:  Head: Normocephalic and atraumatic.  Right Ear: Hearing, tympanic membrane, external ear and ear canal normal.  Left Ear: Hearing, tympanic membrane,  external ear and ear canal normal.  Nose: Mucosal edema present. Right sinus exhibits maxillary sinus tenderness. Right sinus exhibits no frontal sinus tenderness. Left sinus exhibits frontal sinus tenderness.  Mouth/Throat: Uvula is midline and mucous membranes are normal. Posterior oropharyngeal erythema present. No oropharyngeal exudate, posterior oropharyngeal edema or tonsillar abscesses.  Eyes: Conjunctivae are normal. No scleral icterus.  Neck: Normal range of motion. Neck supple.  Cardiovascular: Normal rate, regular rhythm and normal heart sounds.   Pulmonary/Chest: Effort normal and breath sounds normal. No stridor. No respiratory distress. She has no wheezes. She has no rales. She exhibits no tenderness.  Abdominal: Soft. She exhibits no distension and no mass. There is no tenderness. There is no rebound and no guarding.  Musculoskeletal: Normal range of motion.  Lymphadenopathy:    She has cervical adenopathy.  Neurological: She is alert.  Skin: Skin is warm and dry. She is not diaphoretic.  Nursing note and vitals reviewed.   ED Course  Procedures (including critical care time)  Labs Review Labs Reviewed - No data to display  Imaging Review No results found.    MDM   1. Acute rhinosinusitis    Pt c/o worsening nasal congestion with frontal headache and sore throat.  Exam c/w rhinosinusitis. Due to the pain and worsening of symptoms despite OTC medications, will tx for bacterial cause.   Rx: Augmentin  Advised pt she may continue to use OTC medications such as Dayquil for congestion, fever and pain. Encouraged rest and fluids. F/u with PCP in 7-10 days for recheck of symptoms if not improving, sooner if worsening.  Pt verbalized understanding and agreement with tx plan.    Noland Fordyce, PA-C 04/18/15 1018

## 2015-04-18 NOTE — ED Notes (Signed)
Sore throat, headache, sinus drainage x 6 days

## 2015-04-18 NOTE — Discharge Instructions (Signed)

## 2017-04-18 ENCOUNTER — Other Ambulatory Visit: Payer: Self-pay

## 2017-04-18 ENCOUNTER — Emergency Department (INDEPENDENT_AMBULATORY_CARE_PROVIDER_SITE_OTHER)
Admission: EM | Admit: 2017-04-18 | Discharge: 2017-04-18 | Disposition: A | Discharge status: Home / Self Care | Payer: BLUE CROSS/BLUE SHIELD | Source: Home / Self Care | Attending: Family Medicine | Admitting: Family Medicine

## 2017-04-18 ENCOUNTER — Encounter: Payer: Self-pay | Admitting: Emergency Medicine

## 2017-04-18 DIAGNOSIS — N3001 Acute cystitis with hematuria: Secondary | ICD-10-CM

## 2017-04-18 LAB — POCT URINALYSIS DIP (MANUAL ENTRY)
Bilirubin, UA: NEGATIVE
Glucose, UA: NEGATIVE mg/dL
Ketones, POC UA: NEGATIVE mg/dL
Nitrite, UA: NEGATIVE
Protein Ur, POC: NEGATIVE mg/dL
Spec Grav, UA: 1.025 (ref 1.010–1.025)
Urobilinogen, UA: 0.2 E.U./dL
pH, UA: 6 (ref 5.0–8.0)

## 2017-04-18 MED ORDER — CEPHALEXIN 500 MG PO CAPS: 500.0000 mg | ORAL_CAPSULE | Freq: Two times a day (BID) | ORAL | 0 refills | Status: DC

## 2017-04-18 NOTE — ED Provider Notes (Signed)
[facility]    CSN: [id number] Arrival date & time: 04/18/17  1112     History   Chief Complaint Chief Complaint  Patient presents with  . Urinary Frequency  . Back Pain  . Chills  . Headache    HPI Megan Gibson is a 46 y.o. female.   HPI Megan Gibson is a 46 y.o. female presenting to UC with c/o sudden onset lower abdominal pain, dysuria, lower back pain, generalized headache and occasional chills that started yesterday.  She has not taken any OTC today.  Denies n/v/d. Hx of UTIs. Similar symptoms. Denies URI symptoms.    Past Medical History:  Diagnosis Date  . Anxiety   . Family history of breast cancer in mother   . Family history of breast cancer in mother   . Family history of colon cancer in mother   . Family history of ovarian cancer   . Family history of pancreatic cancer   . History of abnormal cervical Pap smear   . Seasonal allergies     Patient Active Problem List   Diagnosis Date Noted  . Family history of ovarian cancer   . Family history of breast cancer in mother   . Family history of colon cancer in mother   . Family history of pancreatic cancer   . Anxiety 05/18/2014  . HPV test positive 05/18/2014  . Insomnia 05/18/2014  . Allergic rhinitis 05/18/2014  . FHx: breast cancer in first degree relative  mother age 66 05/18/2014  . FH: ovarian cancer in first degree relative  mother 05/18/2014  . FH: colon cancer in relative <63 years old  mother age 105's 05/18/2014    Past Surgical History:  Procedure Laterality Date  . ABLATION  11/2011  . CESAREAN SECTION    . CHOLECYSTECTOMY      OB History    No data available       Home Medications    Prior to Admission medications   Medication Sig Start Date End Date Taking? Authorizing Provider  amoxicillin-clavulanate (AUGMENTIN) 875-125 MG tablet Take 1 tablet by mouth 2 (two) times daily. One po bid x 7 days 04/18/15   Noe Gens, PA-C  cephALEXin (KEFLEX) 500 MG capsule  Take 1 capsule (500 mg total) by mouth 2 (two) times daily. 04/18/17   Noe Gens, PA-C  fexofenadine (ALLEGRA) 180 MG tablet Take 180 mg by mouth daily.    [provider]  fluticasone Asencion Islam) 50 MCG/ACT nasal spray One spray nostril daily for 2 weeks then stop   Give generic 10/05/14   Schoenhoff, Altamese Cabal, MD  traZODone (DESYREL) 100 MG tablet Take one tablet hs prn sleep 10/05/14   Schoenhoff, Altamese Cabal, MD    Family History Family History  Problem Relation Age of Onset  . Breast cancer Mother        dx in her 73s  . Ovarian cancer Mother 29  . Colon cancer Mother 57  . Heart disease Father   . Diabetes Father   . Diabetes Maternal Grandmother   . Heart disease Maternal Grandmother   . Pancreatic cancer Maternal Grandfather        dx and died in his 36s  . Ovarian cancer Maternal Aunt        dx in her 42s  . Lung cancer Maternal Uncle   . Multiple sclerosis Maternal Aunt   . Prostate cancer Maternal Uncle        metastized to liver  .  Breast cancer Cousin        maternal first cousin dx in her 62s  . Cancer Cousin        maternal first cousin dx with cancer NOS, causing her to have a TAH-BSO in her 27s    Social History Social History   Tobacco Use  . Smoking status: Never Smoker  . Smokeless tobacco: Never Used  Substance Use Topics  . Alcohol use: Yes    Alcohol/week: 0.0 oz    Comment: 5/month  . Drug use: No     Allergies   Patient has no known allergies.   Review of Systems Review of Systems  Constitutional: Negative for chills and fever.  HENT: Negative for congestion, ear pain, sore throat, trouble swallowing and voice change.   Respiratory: Negative for cough and shortness of breath.   Cardiovascular: Negative for chest pain and palpitations.  Gastrointestinal: Positive for abdominal pain. Negative for diarrhea, nausea and vomiting.  Genitourinary: Positive for dysuria, frequency, pelvic pain and urgency. Negative for vaginal bleeding,  vaginal discharge and vaginal pain.  Musculoskeletal: Positive for back pain. Negative for arthralgias and myalgias.  Skin: Negative for rash.  Neurological: Positive for headaches. Negative for dizziness and light-headedness.     Physical Exam Triage Vital Signs ED Triage Vitals  Enc Vitals Group     BP 04/18/17 1135 117/77     Pulse Rate 04/18/17 1135 (!) 102     Resp 04/18/17 1135 16     Temp 04/18/17 1135 (!) 97.5 F (36.4 C)     Temp Source 04/18/17 1135 Oral     SpO2 04/18/17 1135 100 %     Weight 04/18/17 1136 168 lb (76.2 kg)     Height 04/18/17 1136 5\' 3"  (1.6 m)     Head Circumference --      Peak Flow --      Pain Score --      Pain Loc --      Pain Edu? --      Excl. in [place]? --    No data found.  Updated Vital Signs BP 117/77 (BP Location: Left Arm)   Pulse (!) 102   Temp (!) 97.5 F (36.4 C) (Oral)   Resp 16   Ht 5\' 3"  (1.6 m)   Wt 168 lb (76.2 kg)   LMP 03/27/2017 (Exact Date)   SpO2 100%   BMI 29.76 kg/m   Visual Acuity Right Eye Distance:   Left Eye Distance:   Bilateral Distance:    Right Eye Near:   Left Eye Near:    Bilateral Near:     Physical Exam  Constitutional: She is oriented to person, place, and time. She appears well-developed and well-nourished.  Non-toxic appearance. She does not appear ill. No distress.  HENT:  Head: Normocephalic and atraumatic.  Eyes: EOM are normal.  Neck: Normal range of motion. Neck supple.  Cardiovascular: Regular rhythm. Tachycardia present.  Pulmonary/Chest: Effort normal and breath sounds normal. She has no wheezes. She has no rales.  Abdominal: Soft. She exhibits no mass. There is tenderness ( suprapubic region). There is no CVA tenderness.  Musculoskeletal: Normal range of motion.  Neurological: She is alert and oriented to person, place, and time.  Skin: Skin is warm and dry.  Psychiatric: She has a normal mood and affect. Her behavior is normal.  Nursing note and vitals reviewed.    UC  Treatments / Results  Labs (all labs ordered are listed, but only abnormal  results are displayed) Labs Reviewed  POCT URINALYSIS DIP (MANUAL ENTRY) - Abnormal; Notable for the following components:      Result Value   Color, UA light yellow (*)    Clarity, UA cloudy (*)    Blood, UA moderate (*)    Leukocytes, UA Small (1+) (*)    All other components within normal limits  URINE CULTURE    EKG  EKG Interpretation None       Radiology No results found.  Procedures Procedures (including critical care time)  Medications Ordered in UC Medications - No data to display   Initial Impression / Assessment and Plan / UC Course  I have reviewed the triage vital signs and the nursing notes.  Pertinent labs & imaging results that were available during my care of the patient were reviewed by me and considered in my medical decision making (see chart for details).     Hx and exam c/w UTI Culture sent Will start on Keflex  Encouraged fluids May take OTC Tylenol, Motrin and Azo as needed F/u with PCP next week if not improving.   Final Clinical Impressions(s) / UC Diagnoses   Final diagnoses:  Acute cystitis with hematuria    ED Discharge Orders        Ordered    cephALEXin (KEFLEX) 500 MG capsule  2 times daily     04/18/17 1142       Controlled Substance Prescriptions [place] Controlled Substance Registry consulted? Not Applicable   Noe Gens, PA-C 04/18/17 1350

## 2017-04-18 NOTE — Discharge Instructions (Signed)
° °  You may try over the counter medication called Azo to help with bladder spasms.  This medication can make your urine orange, which is normal.   You may take 500mg  acetaminophen every 4-6 hours or in combination with ibuprofen 400-600mg  every 6-8 hours as needed for pain, inflammation, and fever.  Be sure to drink at least eight 8oz glasses of water to stay well hydrated and get at least 8 hours of sleep at night, preferably more while sick.

## 2017-04-18 NOTE — ED Triage Notes (Signed)
Reports onset of frequency of urination, pain in lower abdomen, headache and occasional chills yesterday. No OTC taken today and refuse any here.

## 2017-04-20 LAB — URINE CULTURE
MICRO NUMBER:: 81322019
SPECIMEN QUALITY:: ADEQUATE

## 2017-04-21 ENCOUNTER — Telehealth: Payer: Self-pay

## 2017-04-21 NOTE — Telephone Encounter (Signed)
Spoke with ptient.  Felling much better.  Will follow up as needed.

## 2017-09-24 ENCOUNTER — Ambulatory Visit (INDEPENDENT_AMBULATORY_CARE_PROVIDER_SITE_OTHER): Payer: BLUE CROSS/BLUE SHIELD | Admitting: Osteopathic Medicine

## 2017-09-24 ENCOUNTER — Encounter: Payer: Self-pay | Admitting: Osteopathic Medicine

## 2017-09-24 VITALS — BP 124/80 | HR 88 | Temp 98.0°F | Ht 63.0 in | Wt 175.4 lb

## 2017-09-24 DIAGNOSIS — Z800 Family history of malignant neoplasm of digestive organs: Secondary | ICD-10-CM

## 2017-09-24 DIAGNOSIS — Z8249 Family history of ischemic heart disease and other diseases of the circulatory system: Secondary | ICD-10-CM | POA: Diagnosis not present

## 2017-09-24 DIAGNOSIS — Z8041 Family history of malignant neoplasm of ovary: Secondary | ICD-10-CM

## 2017-09-24 DIAGNOSIS — F411 Generalized anxiety disorder: Secondary | ICD-10-CM

## 2017-09-24 DIAGNOSIS — J302 Other seasonal allergic rhinitis: Secondary | ICD-10-CM

## 2017-09-24 DIAGNOSIS — Z8639 Personal history of other endocrine, nutritional and metabolic disease: Secondary | ICD-10-CM

## 2017-09-24 DIAGNOSIS — Z0000 Encounter for general adult medical examination without abnormal findings: Secondary | ICD-10-CM | POA: Diagnosis not present

## 2017-09-24 MED ORDER — BUPROPION HCL ER (XL) 150 MG PO TB24: 150.0000 mg | ORAL_TABLET | ORAL | 1 refills | Status: DC

## 2017-09-24 NOTE — Progress Notes (Signed)
HPI: Megan Gibson is a 47 y.o. female who  has a past medical history of Anxiety, Family history of breast cancer in mother, Family history of breast cancer in mother, Family history of colon cancer in mother, Family history of ovarian cancer, Family history of pancreatic cancer, History of abnormal cervical Pap smear, and Seasonal allergies.  she presents to [facility] today, 09/24/17,  for chief complaint of: Establish care, annual physical if possible See headings below  Annual physical exam See below for review of preventive care.  Increased stress/anxiety Finding herself more irritable, anxious.  Difficulty sleeping.  Stress eating, weight gain.  Has been more stressed at work.  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  Family history of colon cancer Has undergone 2 colonoscopies, no concerns.  Mother was diagnosed at a young age and died from colon cancer.  Family history of MI  Father at a young age.  Family history of breast and ovarian cancer in mother. Allowing with routine screening mammograms.  Ca125 negative in 2015   History of high cholesterol Patient has never had to be on medications for this issue but of course family history is a concern.  Seasonal allergies: Uses Flonase, Allegra, Sudafed as needed.      Past medical, surgical, social and family history reviewed:  Patient Active Problem List   Diagnosis Date Noted  . Family history of ovarian cancer   . Family history of breast cancer in mother   . Family history of colon cancer in mother   . Family history of pancreatic cancer   . Anxiety 05/18/2014  . HPV test positive 05/18/2014  . Insomnia 05/18/2014  . Allergic rhinitis 05/18/2014  . FHx: breast cancer in first degree relative  mother age 36 05/18/2014  . FH: ovarian cancer in first degree relative  mother 05/18/2014  . FH: colon cancer in relative <29 years old  mother age 70's  05/18/2014    Past Surgical History:  Procedure Laterality Date  . ABLATION  11/2011  . CESAREAN SECTION    . CHOLECYSTECTOMY      Social History   Tobacco Use  . Smoking status: Never Smoker  . Smokeless tobacco: Never Used  Substance Use Topics  . Alcohol use: Yes    Alcohol/week: 0.0 oz    Comment: 5/month    Family History  Problem Relation Age of Onset  . Breast cancer Mother        dx in her 81s  . Ovarian cancer Mother 41  . Colon cancer Mother 46  . Heart disease Father   . Diabetes Father   . Diabetes Maternal Grandmother   . Heart disease Maternal Grandmother   . Pancreatic cancer Maternal Grandfather        dx and died in his 43s  . Ovarian cancer Maternal Aunt        dx in her 58s  . Lung cancer Maternal Uncle   . Multiple sclerosis Maternal Aunt   . Prostate cancer Maternal Uncle        metastized to liver  . Breast cancer Cousin        maternal first cousin dx in her 18s  . Cancer Cousin        maternal first cousin dx with cancer NOS, causing her to have a TAH-BSO in her 66s     Current medication list and allergy/intolerance information reviewed:    Current Outpatient Medications  Medication Sig Dispense  Refill  . fexofenadine (ALLEGRA) 180 MG tablet Take 180 mg by mouth daily.    . fluticasone (FLONASE) 50 MCG/ACT nasal spray One spray nostril daily for 2 weeks then stop   Give generic 16 g 1  . pseudoephedrine (SUDAFED) 30 MG tablet Take 30 mg by mouth every 4 (four) hours as needed for congestion.    .   30 tablet 1   No current facility-administered medications for this visit.     No Known Allergies    Review of Systems:  Constitutional:  No  fever, no chills, No recent illness, +unintentional weight changes. No significant fatigue.   HEENT: No  headache, no vision change,  Cardiac: No  chest pain, No  pressure, No palpitations, No  Orthopnea  Respiratory:  No  shortness of breath. No  Cough  Gastrointestinal: No  abdominal  pain, No  nausea, No  vomiting  Musculoskeletal: No new myalgia/arthralgia  Skin: No  Rash or other lesions  Genitourinary: No  incontinence, No  abnormal genital bleeding, No abnormal genital discharge  Endocrine: No cold intolerance,  No heat intolerance. No polyuria/polydipsia/polyphagia   Neurologic: No  weakness, No  dizziness, No  slurred speech/focal weakness/facial droop  Psychiatric: No  concerns with depression, +concerns with anxiety, +sleep problems, No mood problems  Exam:  BP 124/80 (BP Location: Left Arm, Patient Position: Sitting, Cuff Size: Normal)   Pulse 88   Temp 98 F (36.7 C) (Oral)   Ht 5\' 3"  (1.6 m)   Wt 175 lb 6.4 oz (79.6 kg)   BMI 31.07 kg/m   Constitutional: VS see above. General Appearance: alert, well-developed, well-nourished, NAD  Eyes: Normal lids and conjunctive, non-icteric sclera  Ears, Nose, Mouth, Throat: MMM, Normal external inspection ears/nares/mouth/lips/gums.    Neck: No masses, trachea midline. No thyroid enlargement. No tenderness/mass appreciated. No lymphadenopathy  Respiratory: Normal respiratory effort. no wheeze, no rhonchi, no rales  Cardiovascular: S1/S2 normal, no murmur, no rub/gallop auscultated. RRR. No lower extremity edema.   Musculoskeletal: Gait normal. No clubbing/cyanosis of digits.   Neurological: Normal balance/coordination. No tremor. No cranial nerve deficit on limited exam. Motor and sensation intact and symmetric. Cerebellar reflexes intact.   Skin: warm, dry, intact. No rash/ulcer. No concerning nevi or subq nodules on limited exam.    Psychiatric: Normal judgment/insight. Normal mood and affect. Oriented x3.      ASSESSMENT/PLAN:   Annual physical exam  Anxiety state - See below for depression/anxiety self reporting questionnaire answers.  Will initiate Wellbutrin today given concerns for weight gain. - Plan: buPROPion (WELLBUTRIN XL) 150 MG 24 hr tablet, CBC, COMPLETE METABOLIC PANEL WITH GFR,  TSH  Family history of colon cancer  Family history of MI (myocardial infarction) - Plan: COMPLETE METABOLIC PANEL WITH GFR, Lipid panel  History of high cholesterol - Plan: Lipid panel  Seasonal allergies  Family history of ovarian cancer  Patient Instructions  Plan:  Start Wellbutrin for anxiety/stress, this will not cause weight gain and hsould help with moods/irritability.   Will call you in 2 weeks to check in on the new medicine, see how it's working for you. Please let us know sooner if there are any problems!       Please note: Preventive care issues were addressed today as part of an annual wellness physical, and this care should be covered under your insurance. However there were other medical issues which were also addressed today, namely stress/anxiety for which we started medication and obtained labs. Your insurance may  bill you separately for a "problem-based visit" for this care. Any questions or concerns about charges which may appear on your billing statement should be directed to your insurance company or to [facility] billing department, and they will contact our office if there are further concerns.         FEMALE PREVENTIVE CARE Updated 09/24/17   ANNUAL SCREENING/COUNSELING  Diet/Exercise - HEALTHY HABITS DISCUSSED TO DECREASE CV RISK Social History   Tobacco Use  Smoking Status Never Smoker  Smokeless Tobacco Never Used    Social History   Substance and Sexual Activity  Alcohol Use Yes  . Alcohol/week: 0.0 oz   Comment: 5/month    Depression screen [facility] 2/9 09/24/2017  Decreased Interest 1  Down, Depressed, Hopeless 1  PHQ - 2 Score 2  Altered sleeping 2  Tired, decreased energy 1  Change in appetite 2  Feeling bad or failure about yourself  0  Trouble concentrating 1  Moving slowly or fidgety/restless 0  Suicidal thoughts 0  PHQ-9 Score 8  Difficult doing work/chores Somewhat difficult   GAD 7 : Generalized Anxiety Score 09/24/2017   Nervous, Anxious, on Edge 2  Control/stop worrying 2  Worry too much - different things 1  Trouble relaxing 2  Restless 0  Easily annoyed or irritable 1  Afraid - awful might happen 0  Total GAD 7 Score 8  Anxiety Difficulty Not difficult at all      Domestic violence concerns - no  HTN SCREENING - SEE [place]  Sexually active in the past year - Yes with female.  Need/want STI testing today? - no  Concerns about libido or pain with sex? - no  Plans for pregnancy? - s/p BTL  INFECTIOUS DISEASE SCREENING  HIV - needs  GC/CT - does not need  HepC - DOB 1945[birth year]1965 - does not need  TB - does not need  DISEASE SCREENING  Lipid - needs  DM2 - needs  Osteoporosis - women age 66+ - does not need  CANCER SCREENING  Cervical - does not need - following with OBGYN  Breast - does not need - following with OBGYN  Lung - does not need  Colon - needs routinely - (+)FH colon cancer, she gets routine colonoscopy  ADULT VACCINATION  Influenza - annual vaccine recommended  Td - booster every 10 years   Zoster - Shingrix recommended 50+  PCV13 - was not indicated  PPSV23 - was not indicated Immunization History  Administered Date(s) Administered  . Influenza,inj,Quad PF,6+ Mos 02/22/2017  . Influenza-Unspecified 05/06/2014  . Tdap 11/29/2014    OTHER  Fall - exercise and Vit D age 54+ - does not need      Visit summary with medication list and pertinent instructions was printed for patient to review. All questions at time of visit were answered - patient instructed to contact office with any additional concerns. ER/RTC precautions were reviewed with the patient.   Follow-up plan: Return in about 1 month (around 10/22/2017) for recheck on Wellbutrin, sooner if needed.    Please note: voice recognition software was used to produce this document, and typos may escape review. Please contact Dr. Sheppard Coil for any needed clarifications.

## 2017-09-24 NOTE — Patient Instructions (Addendum)
Plan:  Start Wellbutrin for anxiety/stress, this will not cause weight gain and hsould help with moods/irritability.   Will call you in 2 weeks to check in on the new medicine, see how it's working for you. Please let us know sooner if there are any problems!       Please note: Preventive care issues were addressed today as part of an annual wellness physical, and this care should be covered under your insurance. However there were other medical issues which were also addressed today, namely stress/anxiety for which we started medication and obtained labs. Your insurance may bill you separately for a "problem-based visit" for this care. Any questions or concerns about charges which may appear on your billing statement should be directed to your insurance company or to [facility] billing department, and they will contact our office if there are further concerns.

## 2017-09-25 ENCOUNTER — Encounter: Payer: Self-pay | Admitting: Osteopathic Medicine

## 2017-09-25 LAB — CBC
HCT: 42.6 % (ref 35.0–45.0)
Hemoglobin: 14.6 g/dL (ref 11.7–15.5)
MCH: 27.9 pg (ref 27.0–33.0)
MCHC: 34.3 g/dL (ref 32.0–36.0)
MCV: 81.3 fL (ref 80.0–100.0)
MPV: 10.1 fL (ref 7.5–12.5)
Platelets: 255 10*3/uL (ref 140–400)
RBC: 5.24 10*6/uL — ABNORMAL HIGH (ref 3.80–5.10)
RDW: 12.1 % (ref 11.0–15.0)
WBC: 7.7 10*3/uL (ref 3.8–10.8)

## 2017-09-25 LAB — LIPID PANEL
Cholesterol: 194 mg/dL (ref <200)
HDL: 61 mg/dL (ref >50)
LDL Cholesterol (Calc): 114 mg/dL (calc) — ABNORMAL HIGH
Non-HDL Cholesterol (Calc): 133 mg/dL (calc) — ABNORMAL HIGH (ref <130)
Total CHOL/HDL Ratio: 3.2 (calc) (ref <5.0)
Triglycerides: 88 mg/dL (ref <150)

## 2017-09-25 LAB — COMPLETE METABOLIC PANEL WITH GFR
AG Ratio: 1.9 (calc) (ref 1.0–2.5)
ALT: 23 U/L (ref 6–29)
AST: 16 U/L (ref 10–35)
Albumin: 4.7 g/dL (ref 3.6–5.1)
Alkaline phosphatase (APISO): 68 U/L (ref 33–115)
BUN: 14 mg/dL (ref 7–25)
CO2: 24 mmol/L (ref 20–32)
Calcium: 9.5 mg/dL (ref 8.6–10.2)
Chloride: 104 mmol/L (ref 98–110)
Creat: 0.84 mg/dL (ref 0.50–1.10)
GFR, Est African American: 97 mL/min/{1.73_m2} (ref >=60)
GFR, Est Non African American: 84 mL/min/{1.73_m2} (ref >=60)
Globulin: 2.5 g/dL (calc) (ref 1.9–3.7)
Glucose, Bld: 87 mg/dL (ref 65–99)
Potassium: 4.6 mmol/L (ref 3.5–5.3)
Sodium: 139 mmol/L (ref 135–146)
Total Bilirubin: 0.5 mg/dL (ref 0.2–1.2)
Total Protein: 7.2 g/dL (ref 6.1–8.1)

## 2017-09-25 LAB — TSH: TSH: 0.8 mIU/L

## 2017-10-10 ENCOUNTER — Telehealth: Payer: Self-pay | Admitting: Osteopathic Medicine

## 2017-10-10 NOTE — Telephone Encounter (Signed)
Please call patient: just calling as a courtesy to see how she is doing a couple weeks after starting the Wellbutrin for axiety. If any problems or questions about the medication, let us know

## 2017-10-10 NOTE — Telephone Encounter (Signed)
-----   Message from Emeterio Reeve, DO sent at 09/26/2017 12:48 PM EDT ----- Regarding: anxiety Started wellbutrin for anxiety, weight concerns

## 2017-10-10 NOTE — Telephone Encounter (Signed)
Left a detailed vm msg for pt re: provider's note.

## 2017-10-22 ENCOUNTER — Ambulatory Visit: Payer: BLUE CROSS/BLUE SHIELD | Admitting: Osteopathic Medicine

## 2018-01-08 ENCOUNTER — Other Ambulatory Visit: Payer: Self-pay

## 2018-01-08 ENCOUNTER — Emergency Department (INDEPENDENT_AMBULATORY_CARE_PROVIDER_SITE_OTHER): Payer: Federal, State, Local not specified - PPO

## 2018-01-08 ENCOUNTER — Emergency Department
Admission: EM | Admit: 2018-01-08 | Discharge: 2018-01-08 | Disposition: A | Discharge status: Home / Self Care | Payer: BLUE CROSS/BLUE SHIELD | Source: Home / Self Care | Attending: Family Medicine | Admitting: Family Medicine

## 2018-01-08 DIAGNOSIS — R1032 Left lower quadrant pain: Secondary | ICD-10-CM

## 2018-01-08 DIAGNOSIS — R3989 Other symptoms and signs involving the genitourinary system: Secondary | ICD-10-CM | POA: Diagnosis not present

## 2018-01-08 DIAGNOSIS — K5900 Constipation, unspecified: Secondary | ICD-10-CM

## 2018-01-08 LAB — POCT URINALYSIS DIP (MANUAL ENTRY)
Bilirubin, UA: NEGATIVE
Glucose, UA: NEGATIVE mg/dL
Ketones, POC UA: NEGATIVE mg/dL
Leukocytes, UA: NEGATIVE
Nitrite, UA: NEGATIVE
Spec Grav, UA: 1.025 (ref 1.010–1.025)
Urobilinogen, UA: 0.2 E.U./dL
pH, UA: 5.5 (ref 5.0–8.0)

## 2018-01-08 NOTE — ED Triage Notes (Signed)
Pt is having lower left side abdominal pain that is constant.  Started Tuesday night, had diarrhea yesterday.

## 2018-01-08 NOTE — Discharge Instructions (Signed)
°  It is recommended that you follow up with your family doctor and/or gastroenterologist for further evaluation and treatment of symptoms if not improving in 2-3 days, sooner if worsening.  Please call 911 or go to the hospital if symptoms worsening- worsening pain, vomiting, fever, diarrhea, blood in stool, or other new concerning symptoms develop.

## 2018-01-08 NOTE — ED Provider Notes (Signed)
[facility]    CSN: [id number] Arrival date & time: 01/08/18  3664     History   Chief Complaint Chief Complaint  Patient presents with  . Abdominal Pain    HPI Megan Gibson is a 47 y.o. female.   HPI Megan Gibson is a 47 y.o. female presenting to UC with c/o Left lower abdominal pain that has been constant since Tuesday (8/13/) night. She was feeling constipated so she drank some tea to help move her bowels. She did have loose stool yesterday but none today. Denies fever, chills, n/v/d. Pain is a dull ache, 6/10 at worst. Hx of similar pain the last time she had a UTI. Denies dysuria or hematuria. Denies vaginal symptoms. Family hx of ovarian and colon cancer. She had a normal pap smear this year and had a normal colonoscopy about 3 years ago. She gets colonoscopies every 5 years.  Hx of cholecystectomy several years ago.    Past Medical History:  Diagnosis Date  . Anxiety   . Family history of breast cancer in mother   . Family history of breast cancer in mother   . Family history of colon cancer in mother   . Family history of ovarian cancer   . Family history of pancreatic cancer   . History of abnormal cervical Pap smear   . Seasonal allergies     Patient Active Problem List   Diagnosis Date Noted  . Family history of ovarian cancer   . Family history of breast cancer in mother   . Family history of colon cancer in mother   . Family history of pancreatic cancer   . Anxiety 05/18/2014  . HPV test positive 05/18/2014  . Insomnia 05/18/2014  . Allergic rhinitis 05/18/2014  . FHx: breast cancer in first degree relative  mother age 56 05/18/2014  . FH: ovarian cancer in first degree relative  mother 05/18/2014  . FH: colon cancer in relative <1 years old  mother age 64's 05/18/2014    Past Surgical History:  Procedure Laterality Date  . ABLATION  11/2011  . CESAREAN SECTION    . CHOLECYSTECTOMY      OB History   None      Home Medications     Prior to Admission medications   Medication Sig Start Date End Date Taking? Authorizing Provider  buPROPion (WELLBUTRIN XL) 150 MG 24 hr tablet Take 1 tablet (150 mg total) by mouth every morning. 09/24/17   Emeterio Reeve, DO  fexofenadine (ALLEGRA) 180 MG tablet Take 180 mg by mouth daily.    [provider]  fluticasone (FLONASE) 50 MCG/ACT nasal spray One spray nostril daily for 2 weeks then stop   Give generic 10/05/14   Schoenhoff, Altamese Cabal, MD  pseudoephedrine (SUDAFED) 30 MG tablet Take 30 mg by mouth every 4 (four) hours as needed for congestion.    [provider]    Family History Family History  Problem Relation Age of Onset  . Breast cancer Mother        dx in her 38s  . Ovarian cancer Mother 45  . Colon cancer Mother 16  . Skin cancer Mother   . Heart disease Father   . Diabetes Father   . High blood pressure Father   . Heart attack Father   . Diabetes type II Father   . Diabetes Maternal Grandmother   . Heart disease Maternal Grandmother   . Pancreatic cancer Maternal Grandfather  dx and died in his 11s  . Ovarian cancer Maternal Aunt        dx in her 94s  . Lung cancer Maternal Uncle   . Multiple sclerosis Maternal Aunt   . Prostate cancer Maternal Uncle        metastized to liver  . Breast cancer Cousin        maternal first cousin dx in her 68s  . Cancer Cousin        maternal first cousin dx with cancer NOS, causing her to have a TAH-BSO in her 30s    Social History Social History   Tobacco Use  . Smoking status: Never Smoker  . Smokeless tobacco: Never Used  Substance Use Topics  . Alcohol use: Yes    Alcohol/week: 0.0 standard drinks    Comment: 5/month  . Drug use: No     Allergies   Patient has no known allergies.   Review of Systems Review of Systems  Constitutional: Negative for appetite change, chills, diaphoresis, fatigue and fever.  Gastrointestinal: Positive for abdominal pain, constipation and  diarrhea. Negative for blood in stool, nausea and vomiting.  Genitourinary: Negative for dysuria, flank pain, frequency, hematuria, pelvic pain, urgency, vaginal bleeding and vaginal discharge.  Musculoskeletal: Negative for back pain and myalgias.     Physical Exam Triage Vital Signs ED Triage Vitals  Enc Vitals Group     BP 01/08/18 0853 126/87     Pulse Rate 01/08/18 0853 91     Resp --      Temp 01/08/18 0853 98.1 F (36.7 C)     Temp Source 01/08/18 0853 Oral     SpO2 01/08/18 0853 98 %     Weight 01/08/18 0854 174 lb (78.9 kg)     Height 01/08/18 0854 5\' 3"  (1.6 m)     Head Circumference --      Peak Flow --      Pain Score 01/08/18 0854 6     Pain Loc --      Pain Edu? --      Excl. in [place]? --    No data found.  Updated Vital Signs BP 126/87 (BP Location: Right Arm)   Pulse 91   Temp 98.1 F (36.7 C) (Oral)   Ht 5\' 3"  (1.6 m)   Wt 174 lb (78.9 kg)   LMP 12/27/2017   SpO2 98%   BMI 30.82 kg/m   Visual Acuity Right Eye Distance:   Left Eye Distance:   Bilateral Distance:    Right Eye Near:   Left Eye Near:    Bilateral Near:     Physical Exam  Constitutional: She is oriented to person, place, and time. She appears well-developed and well-nourished.  Non-toxic appearance. She does not appear ill. No distress.  Pt sitting comfortably on exam bed, NAD  HENT:  Head: Normocephalic and atraumatic.  Mouth/Throat: Oropharynx is clear and moist.  Eyes: EOM are normal.  Neck: Normal range of motion.  Cardiovascular: Normal rate and regular rhythm.  Pulmonary/Chest: Effort normal and breath sounds normal.  Abdominal: Normal appearance. She exhibits no distension. There is tenderness in the suprapubic area and left lower quadrant. There is no rigidity, no rebound, no guarding, no CVA tenderness, no tenderness at McBurney's point and negative Murphy's sign.  Musculoskeletal: Normal range of motion.  Neurological: She is alert and oriented to person, place, and  time.  Skin: Skin is warm and dry.  Psychiatric: She has a normal mood  and affect. Her behavior is normal.  Nursing note and vitals reviewed.    UC Treatments / Results  Labs (all labs ordered are listed, but only abnormal results are displayed) Labs Reviewed  POCT URINALYSIS DIP (MANUAL ENTRY) - Abnormal; Notable for the following components:      Result Value   Blood, UA small (*)    Protein Ur, POC trace (*)    All other components within normal limits  URINE CULTURE    EKG None  Radiology US Pelvic Complete With Transvaginal  Result Date: 01/08/2018 CLINICAL DATA:  Left lower quadrant pain, dull pressure EXAM: TRANSABDOMINAL AND TRANSVAGINAL ULTRASOUND OF PELVIS TECHNIQUE: Both transabdominal and transvaginal ultrasound examinations of the pelvis were performed. Transabdominal technique was performed for global imaging of the pelvis including uterus, ovaries, adnexal regions, and pelvic cul-de-sac. It was necessary to proceed with endovaginal exam following the transabdominal exam to visualize the endometrium and ovaries. COMPARISON:  None FINDINGS: Uterus Measurements: 8.8 x 4.6 x 4.8 cm. No fibroids or other mass visualized. Endometrium Thickness: 4.3 mm.  No focal abnormality visualized. Right ovary Measurements: 2.1 x 1.4 x 1.5 cm. Normal appearance/no adnexal mass. Left ovary Measurements: 3.5 x 2.1 x 2 cm.  Normal appearance/no adnexal mass. Other findings Trace pelvic free fluid. IMPRESSION: Normal pelvic ultrasound. Electronically Signed   By: Kathreen Devoid   On: 01/08/2018 10:36    Procedures Procedures (including critical care time)  Medications Ordered in UC Medications - No data to display  Initial Impression / Assessment and Plan / UC Course  I have reviewed the triage vital signs and the nursing notes.  Pertinent labs & imaging results that were available during my care of the patient were reviewed by me and considered in my medical decision making (see chart for  details).    Doubt diverticulitis. DDx: constipation, ovarian cyst or fibroid  Discussed imaging with pt. Encouraged stool softeners and home and f/u with PCP and GI  Final Clinical Impressions(s) / UC Diagnoses   Final diagnoses:  Sensation of pressure in bladder area  LLQ abdominal pain  Constipation, unspecified constipation type     Discharge Instructions      It is recommended that you follow up with your family doctor and/or gastroenterologist for further evaluation and treatment of symptoms if not improving in 2-3 days, sooner if worsening.  Please call 911 or go to the hospital if symptoms worsening- worsening pain, vomiting, fever, diarrhea, blood in stool, or other new concerning symptoms develop.    ED Prescriptions    None     Controlled Substance Prescriptions [place] Controlled Substance Registry consulted? Not Applicable   Tyrell Antonio 01/08/18 1450

## 2018-01-10 ENCOUNTER — Telehealth: Payer: Self-pay

## 2018-01-10 LAB — URINE CULTURE
MICRO NUMBER:: 90971193
SPECIMEN QUALITY:: ADEQUATE

## 2018-01-10 NOTE — Telephone Encounter (Signed)
Pt notified of normal UCX results.

## 2018-01-12 ENCOUNTER — Inpatient Hospital Stay (HOSPITAL_BASED_OUTPATIENT_CLINIC_OR_DEPARTMENT_OTHER)
Admission: EM | Admit: 2018-01-12 | Discharge: 2018-01-16 | DRG: 373 | Disposition: A | Discharge status: Home / Self Care | Payer: BLUE CROSS/BLUE SHIELD | Attending: Internal Medicine | Admitting: Internal Medicine

## 2018-01-12 ENCOUNTER — Other Ambulatory Visit: Payer: Self-pay

## 2018-01-12 ENCOUNTER — Emergency Department (HOSPITAL_BASED_OUTPATIENT_CLINIC_OR_DEPARTMENT_OTHER): Payer: BLUE CROSS/BLUE SHIELD

## 2018-01-12 ENCOUNTER — Encounter (HOSPITAL_BASED_OUTPATIENT_CLINIC_OR_DEPARTMENT_OTHER): Payer: Self-pay | Admitting: Emergency Medicine

## 2018-01-12 DIAGNOSIS — R000 Tachycardia, unspecified: Secondary | ICD-10-CM | POA: Diagnosis present

## 2018-01-12 DIAGNOSIS — K5732 Diverticulitis of large intestine without perforation or abscess without bleeding: Secondary | ICD-10-CM

## 2018-01-12 DIAGNOSIS — Z833 Family history of diabetes mellitus: Secondary | ICD-10-CM | POA: Diagnosis not present

## 2018-01-12 DIAGNOSIS — E860 Dehydration: Secondary | ICD-10-CM | POA: Diagnosis present

## 2018-01-12 DIAGNOSIS — R109 Unspecified abdominal pain: Secondary | ICD-10-CM | POA: Diagnosis not present

## 2018-01-12 DIAGNOSIS — K659 Peritonitis, unspecified: Principal | ICD-10-CM | POA: Diagnosis present

## 2018-01-12 DIAGNOSIS — R1030 Lower abdominal pain, unspecified: Secondary | ICD-10-CM | POA: Diagnosis not present

## 2018-01-12 DIAGNOSIS — K5792 Diverticulitis of intestine, part unspecified, without perforation or abscess without bleeding: Secondary | ICD-10-CM

## 2018-01-12 DIAGNOSIS — Z800 Family history of malignant neoplasm of digestive organs: Secondary | ICD-10-CM | POA: Diagnosis not present

## 2018-01-12 DIAGNOSIS — Z801 Family history of malignant neoplasm of trachea, bronchus and lung: Secondary | ICD-10-CM | POA: Diagnosis not present

## 2018-01-12 DIAGNOSIS — Z803 Family history of malignant neoplasm of breast: Secondary | ICD-10-CM

## 2018-01-12 DIAGNOSIS — J309 Allergic rhinitis, unspecified: Secondary | ICD-10-CM | POA: Diagnosis present

## 2018-01-12 DIAGNOSIS — F419 Anxiety disorder, unspecified: Secondary | ICD-10-CM | POA: Diagnosis present

## 2018-01-12 DIAGNOSIS — Z8249 Family history of ischemic heart disease and other diseases of the circulatory system: Secondary | ICD-10-CM

## 2018-01-12 DIAGNOSIS — Z8041 Family history of malignant neoplasm of ovary: Secondary | ICD-10-CM | POA: Diagnosis not present

## 2018-01-12 DIAGNOSIS — R1010 Upper abdominal pain, unspecified: Secondary | ICD-10-CM | POA: Diagnosis not present

## 2018-01-12 LAB — URINALYSIS, ROUTINE W REFLEX MICROSCOPIC
Bilirubin Urine: NEGATIVE
Glucose, UA: NEGATIVE mg/dL
Ketones, ur: NEGATIVE mg/dL
Nitrite: NEGATIVE
Protein, ur: NEGATIVE mg/dL
Specific Gravity, Urine: <1.005 — ABNORMAL LOW (ref 1.005–1.030)
pH: 6.5 (ref 5.0–8.0)

## 2018-01-12 LAB — COMPREHENSIVE METABOLIC PANEL
ALT: 46 U/L — ABNORMAL HIGH (ref 0–44)
AST: 37 U/L (ref 15–41)
Albumin: 3.8 g/dL (ref 3.5–5.0)
Alkaline Phosphatase: 91 U/L (ref 38–126)
Anion gap: 12 (ref 5–15)
BUN: 7 mg/dL (ref 6–20)
CO2: 25 mmol/L (ref 22–32)
Calcium: 8.8 mg/dL — ABNORMAL LOW (ref 8.9–10.3)
Chloride: 99 mmol/L (ref 98–111)
Creatinine, Ser: 0.72 mg/dL (ref 0.44–1.00)
GFR calc Af Amer: >60 mL/min (ref >60)
GFR calc non Af Amer: >60 mL/min (ref >60)
Glucose, Bld: 126 mg/dL — ABNORMAL HIGH (ref 70–99)
Potassium: 3.7 mmol/L (ref 3.5–5.1)
Sodium: 136 mmol/L (ref 135–145)
Total Bilirubin: 1 mg/dL (ref 0.3–1.2)
Total Protein: 8 g/dL (ref 6.5–8.1)

## 2018-01-12 LAB — WET PREP, GENITAL
Clue Cells Wet Prep HPF POC: NONE SEEN
Sperm: NONE SEEN
Trich, Wet Prep: NONE SEEN
Yeast Wet Prep HPF POC: NONE SEEN

## 2018-01-12 LAB — URINALYSIS, MICROSCOPIC (REFLEX)

## 2018-01-12 LAB — CBC WITH DIFFERENTIAL/PLATELET
Basophils Absolute: 0 10*3/uL (ref 0.0–0.1)
Basophils Relative: 0 %
Eosinophils Absolute: 0 10*3/uL (ref 0.0–0.7)
Eosinophils Relative: 0 %
HCT: 41.1 % (ref 36.0–46.0)
Hemoglobin: 14.2 g/dL (ref 12.0–15.0)
Lymphocytes Relative: 9 %
Lymphs Abs: 1.5 10*3/uL (ref 0.7–4.0)
MCH: 28 pg (ref 26.0–34.0)
MCHC: 34.5 g/dL (ref 30.0–36.0)
MCV: 81.1 fL (ref 78.0–100.0)
Monocytes Absolute: 1.8 10*3/uL — ABNORMAL HIGH (ref 0.1–1.0)
Monocytes Relative: 11 %
Neutro Abs: 13 10*3/uL — ABNORMAL HIGH (ref 1.7–7.7)
Neutrophils Relative %: 79 %
Platelets: 243 10*3/uL (ref 150–400)
RBC: 5.07 MIL/uL (ref 3.87–5.11)
RDW: 12.3 % (ref 11.5–15.5)
WBC: 16.4 10*3/uL — ABNORMAL HIGH (ref 4.0–10.5)

## 2018-01-12 LAB — I-STAT CG4 LACTIC ACID, ED: Lactic Acid, Venous: 1.79 mmol/L (ref 0.5–1.9)

## 2018-01-12 LAB — PREGNANCY, URINE: Preg Test, Ur: NEGATIVE

## 2018-01-12 LAB — LIPASE, BLOOD: Lipase: 23 U/L (ref 11–51)

## 2018-01-12 MED ORDER — TRAMADOL HCL 50 MG PO TABS
50.0000 mg | ORAL_TABLET | Freq: Four times a day (QID) | ORAL | Status: DC | PRN
  Administered 2018-01-13 – 2018-01-15 (×8): 50 mg via ORAL
  Filled 2018-01-12 (×8): qty 1

## 2018-01-12 MED ORDER — SODIUM CHLORIDE 0.9 % IV SOLN
1.0000 g | Freq: Once | INTRAVENOUS | Status: AC
  Administered 2018-01-12: 1 g via INTRAVENOUS
  Filled 2018-01-12: qty 10

## 2018-01-12 MED ORDER — FENTANYL CITRATE (PF) 100 MCG/2ML IJ SOLN
50.0000 ug | Freq: Once | INTRAMUSCULAR | Status: AC
  Administered 2018-01-12: 50 ug via INTRAVENOUS
  Filled 2018-01-12: qty 2

## 2018-01-12 MED ORDER — FLUTICASONE PROPIONATE 50 MCG/ACT NA SUSP
2.0000 | Freq: Every day | NASAL | Status: DC
  Administered 2018-01-12 – 2018-01-16 (×2): 2 via NASAL
  Filled 2018-01-12: qty 16

## 2018-01-12 MED ORDER — PIPERACILLIN-TAZOBACTAM 3.375 G IVPB
3.3750 g | Freq: Three times a day (TID) | INTRAVENOUS | Status: DC
  Administered 2018-01-12 – 2018-01-15 (×8): 3.375 g via INTRAVENOUS
  Filled 2018-01-12 (×9): qty 50

## 2018-01-12 MED ORDER — MORPHINE SULFATE (PF) 2 MG/ML IV SOLN
2.0000 mg | INTRAVENOUS | Status: DC | PRN
  Administered 2018-01-12 – 2018-01-13 (×2): 3 mg via INTRAVENOUS
  Filled 2018-01-12 (×2): qty 2
  Filled 2018-01-12: qty 1

## 2018-01-12 MED ORDER — ONDANSETRON HCL 4 MG PO TABS
4.0000 mg | ORAL_TABLET | Freq: Four times a day (QID) | ORAL | Status: DC | PRN
  Administered 2018-01-15: 4 mg via ORAL
  Filled 2018-01-12: qty 1

## 2018-01-12 MED ORDER — SODIUM CHLORIDE 0.9 % IV SOLN
INTRAVENOUS | Status: DC
  Administered 2018-01-12 – 2018-01-15 (×2): via INTRAVENOUS

## 2018-01-12 MED ORDER — SODIUM CHLORIDE 0.9 % IV SOLN
100.0000 mg | Freq: Two times a day (BID) | INTRAVENOUS | Status: DC
  Administered 2018-01-12 – 2018-01-14 (×4): 100 mg via INTRAVENOUS
  Filled 2018-01-12 (×4): qty 100

## 2018-01-12 MED ORDER — ONDANSETRON HCL 4 MG/2ML IJ SOLN
4.0000 mg | Freq: Four times a day (QID) | INTRAMUSCULAR | Status: DC | PRN
  Administered 2018-01-13: 4 mg via INTRAVENOUS
  Filled 2018-01-12: qty 2

## 2018-01-12 MED ORDER — LORATADINE 10 MG PO TABS
10.0000 mg | ORAL_TABLET | Freq: Every day | ORAL | Status: DC
  Administered 2018-01-16: 10 mg via ORAL
  Filled 2018-01-12 (×3): qty 1

## 2018-01-12 MED ORDER — SODIUM CHLORIDE 0.9 % IV SOLN
INTRAVENOUS | Status: DC | PRN
  Administered 2018-01-12: 1000 mL via INTRAVENOUS
  Administered 2018-01-12: 500 mL via INTRAVENOUS

## 2018-01-12 MED ORDER — SODIUM CHLORIDE 0.9 % IV BOLUS
1000.0000 mL | Freq: Once | INTRAVENOUS | Status: AC
Start: 2018-01-12 — End: 2018-01-12
  Administered 2018-01-12: 1000 mL via INTRAVENOUS

## 2018-01-12 MED ORDER — DOCUSATE SODIUM 100 MG PO CAPS
100.0000 mg | ORAL_CAPSULE | Freq: Every day | ORAL | Status: DC
  Administered 2018-01-13 – 2018-01-14 (×2): 100 mg via ORAL
  Filled 2018-01-12 (×3): qty 1

## 2018-01-12 MED ORDER — ACETAMINOPHEN 650 MG RE SUPP: 650.0000 mg | Freq: Four times a day (QID) | RECTAL | Status: DC | PRN

## 2018-01-12 MED ORDER — ACETAMINOPHEN 325 MG PO TABS
650.0000 mg | ORAL_TABLET | Freq: Four times a day (QID) | ORAL | Status: DC | PRN
  Administered 2018-01-12 – 2018-01-15 (×9): 650 mg via ORAL
  Filled 2018-01-12 (×9): qty 2

## 2018-01-12 MED ORDER — ENOXAPARIN SODIUM 40 MG/0.4ML ~~LOC~~ SOLN
40.0000 mg | SUBCUTANEOUS | Status: DC
  Filled 2018-01-12: qty 0.4

## 2018-01-12 MED ORDER — PIPERACILLIN-TAZOBACTAM 3.375 G IVPB 30 MIN
3.3750 g | Freq: Once | INTRAVENOUS | Status: AC
  Administered 2018-01-12: 3.375 g via INTRAVENOUS
  Filled 2018-01-12 (×2): qty 50

## 2018-01-12 NOTE — Consult Note (Signed)
Reason for Consult abdominal pain Referring Physician: [place] Chief complaint: Left lower abdominal pain   Megan Gibson is an 47 y.o. female.    HPI: Patient is a 47 year old female who presented on 01/08/2018 with left lower quadrant pain to the urgent care center in [place].  She was tender in the left lower quadrant during that visit.  They did not think she had diverticulitis.  They recommended stool softeners and follow-up with PCP and GI.  She had a normal urine culture.  Patient returned today to [place] with left lower quadrant pain x6 days.  Pain is progressively worse.  She has had not had a bowel movement in 5 days he has taken MiraLAX with minimal results but noted blood from the rectum this AM.  Pelvic ultrasound obtained on her initial presentation 01/08/2018, was normal.  She states that the pain is sharp and cramping and constant in quality, is aggravated by moving, does not notice much difference after eating. No nausea or vomiting. She has previous had a cholecystectomy and 2 C-sections via Pfannenstiel. Her mother died of colon cancer at age 32 and therefore she has been getting colonoscopies since the age of 69. She states her last one was 2 years ago and was negative, she states she has never heard any mention of diverticulosis.  Work-up in the ED today shows she is afebrile slightly tachycardic blood pressure stable.  Labs show a CMP is essentially normal, glucose elevated at 126, calcium 8.8.  ALT is 46, ALT is 37, lipase is 23.  Lactate is 1.79.  WBC 16.4, hemoglobin 14.2, hematocrit 41.1, platelets 243,000.  Wet prep is negative for yeast trichomonas clue cells are many white cells.  CT renal study this a.m.: Inflammatory stranding left lower quadrant adjacent to the mid sigmoid colon left ovary left ovary appears prominent.  Differential considerations would include colitis/diverticulitis the sigmoid, pelvic inflammatory disease or ovarian torsion.  The left  ovary appeared to be normal on recent ultrasound.  Patient underwent another ultrasound of the pelvis this a.m.  This included both transabdominal and transvaginal procedures.  Showed no ovarian torsion.  Left adnexa was 7.7 x 2.1 x 4.4 cm soft tissue mass area with increased Doppler flow concerning for phlegmon again could be diverticulitis or PID or tubo-ovarian abscess.  Since the phlegmon abuts the left ovary PID/tubo-ovarian abscess would be considered most likely.  Is a moderate amount of free fluid and we are asked to see.  She is to be admitted by medicine.    Past Medical History:  Diagnosis Date  . Anxiety   . Family history of breast cancer in mother   . Family history of breast cancer in mother   . Family history of colon cancer in mother   . Family history of ovarian cancer   . Family history of pancreatic cancer   . History of abnormal cervical Pap smear   . Seasonal allergies     Past Surgical History:  Procedure Laterality Date  . ABLATION  11/2011  . CESAREAN SECTION    . CHOLECYSTECTOMY    . TUBAL LIGATION      Family History  Problem Relation Age of Onset  . Breast cancer Mother        dx in her 4s  . Ovarian cancer Mother 45  . Colon cancer Mother 66  . Skin cancer Mother   . Heart disease Father   . Diabetes Father   . High blood pressure Father   .  Heart attack Father   . Diabetes type II Father   . Diabetes Maternal Grandmother   . Heart disease Maternal Grandmother   . Pancreatic cancer Maternal Grandfather        dx and died in his 32s  . Ovarian cancer Maternal Aunt        dx in her 89s  . Lung cancer Maternal Uncle   . Multiple sclerosis Maternal Aunt   . Prostate cancer Maternal Uncle        metastized to liver  . Breast cancer Cousin        maternal first cousin dx in her 77s  . Cancer Cousin        maternal first cousin dx with cancer NOS, causing her to have a TAH-BSO in her 77s    Social History:  reports that she has never smoked.  She has never used smokeless tobacco. She reports that she drinks alcohol. She reports that she does not use drugs.  Allergies: No Known Allergies  Medications: Prior to Admission:  Prior to Admission medications   Medication Sig Start Date End Date Taking? Authorizing Provider  buPROPion (WELLBUTRIN XL) 150 MG 24 hr tablet Take 1 tablet (150 mg total) by mouth every morning. 09/24/17   Emeterio Reeve, DO  fexofenadine (ALLEGRA) 180 MG tablet Take 180 mg by mouth daily.    [provider]  fluticasone (FLONASE) 50 MCG/ACT nasal spray One spray nostril daily for 2 weeks then stop   Give generic 10/05/14   Schoenhoff, Altamese Cabal, MD  pseudoephedrine (SUDAFED) 30 MG tablet Take 30 mg by mouth every 4 (four) hours as needed for congestion.    [provider]     Results for orders placed or performed during the hospital encounter of 01/12/18 (from the past 48 hour(s))  Comprehensive metabolic panel     Status: Abnormal   Collection Time: 01/12/18  9:23 AM  Result Value Ref Range   Sodium 136 135 - 145 mmol/L   Potassium 3.7 3.5 - 5.1 mmol/L   Chloride 99 98 - 111 mmol/L   CO2 25 22 - 32 mmol/L   Glucose, Bld 126 (H) 70 - 99 mg/dL   BUN 7 6 - 20 mg/dL   Creatinine, Ser 0.72 0.44 - 1.00 mg/dL   Calcium 8.8 (L) 8.9 - 10.3 mg/dL   Total Protein 8.0 6.5 - 8.1 g/dL   Albumin 3.8 3.5 - 5.0 g/dL   AST 37 15 - 41 U/L   ALT 46 (H) 0 - 44 U/L   Alkaline Phosphatase 91 38 - 126 U/L   Total Bilirubin 1.0 0.3 - 1.2 mg/dL   GFR calc non Af Amer >60 >60 mL/min   GFR calc Af Amer >60 >60 mL/min    Comment: (NOTE) The eGFR has been calculated using the CKD EPI equation. This calculation has not been validated in all clinical situations. eGFR's persistently <60 mL/min signify possible Chronic Kidney Disease.    Anion gap 12 5 - 15    Comment: Performed at [facility], [address] [place], [place] [address]  Lipase, blood     Status: None   Collection Time:  01/12/18  9:23 AM  Result Value Ref Range   Lipase 23 11 - 51 U/L    Comment: Performed at [facility], [address] [place], [place] [address]  CBC with Differential     Status: Abnormal   Collection Time: 01/12/18  9:23  AM  Result Value Ref Range   WBC 16.4 (H) 4.0 - 10.5 K/uL   RBC 5.07 3.87 - 5.11 MIL/uL   Hemoglobin 14.2 12.0 - 15.0 g/dL   HCT 41.1 36.0 - 46.0 %   MCV 81.1 78.0 - 100.0 fL   MCH 28.0 26.0 - 34.0 pg   MCHC 34.5 30.0 - 36.0 g/dL   RDW 12.3 11.5 - 15.5 %   Platelets 243 150 - 400 K/uL   Neutrophils Relative % 79 %   Neutro Abs 13.0 (H) 1.7 - 7.7 K/uL   Lymphocytes Relative 9 %   Lymphs Abs 1.5 0.7 - 4.0 K/uL   Monocytes Relative 11 %   Monocytes Absolute 1.8 (H) 0.1 - 1.0 K/uL   Eosinophils Relative 0 %   Eosinophils Absolute 0.0 0.0 - 0.7 K/uL   Basophils Relative 0 %   Basophils Absolute 0.0 0.0 - 0.1 K/uL    Comment: Performed at [facility], [address] [place], [place] [address]  Urinalysis, Routine w reflex microscopic     Status: Abnormal   Collection Time: 01/12/18  9:56 AM  Result Value Ref Range   Color, Urine YELLOW YELLOW   APPearance CLEAR CLEAR   Specific Gravity, Urine <1.005 (L) 1.005 - 1.030   pH 6.5 5.0 - 8.0   Glucose, UA NEGATIVE NEGATIVE mg/dL   Hgb urine dipstick LARGE (A) NEGATIVE   Bilirubin Urine NEGATIVE NEGATIVE   Ketones, ur NEGATIVE NEGATIVE mg/dL   Protein, ur NEGATIVE NEGATIVE mg/dL   Nitrite NEGATIVE NEGATIVE   Leukocytes, UA MODERATE (A) NEGATIVE    Comment: Performed at [facility], [address] [place], [place] [address]  Pregnancy, urine     Status: None   Collection Time: 01/12/18  9:56 AM  Result Value Ref Range   Preg Test, Ur NEGATIVE NEGATIVE    Comment:        THE SENSITIVITY OF THIS METHODOLOGY IS >20 mIU/mL. Performed at [facility], [address] [place], [place] [address]   Urinalysis, Microscopic (reflex)     Status: Abnormal   Collection  Time: 01/12/18  9:56 AM  Result Value Ref Range   RBC / HPF 0-5 0 - 5 RBC/hpf   WBC, UA 6-10 0 - 5 WBC/hpf   Bacteria, UA MANY (A) NONE SEEN   Squamous Epithelial / LPF 0-5 0 - 5    Comment: Performed at [facility], [address] [place], [place] [address]  Wet prep, genital     Status: Abnormal   Collection Time: 01/12/18 11:10 AM  Result Value Ref Range   Yeast Wet Prep HPF POC NONE SEEN NONE SEEN   Trich, Wet Prep NONE SEEN NONE SEEN   Clue Cells Wet Prep HPF POC NONE SEEN NONE SEEN   WBC, Wet Prep HPF POC MANY (A) NONE SEEN   Sperm NONE SEEN     Comment: Performed at [facility], [address] [place], [place] [address]  I-Stat CG4 Lactic Acid, ED     Status: None   Collection Time: 01/12/18  1:42 PM  Result Value Ref Range   Lactic Acid, Venous 1.79 0.5 - 1.9 mmol/L    US Transvaginal Non-ob  Result Date: 01/12/2018 CLINICAL DATA:  Left elbow pain EXAM: TRANSABDOMINAL AND TRANSVAGINAL ULTRASOUND OF PELVIS DOPPLER ULTRASOUND OF OVARIES TECHNIQUE: Both transabdominal and transvaginal ultrasound examinations of the pelvis were performed. Transabdominal technique  was performed for global imaging of the pelvis including uterus, ovaries, adnexal regions, and pelvic cul-de-sac. It was necessary to proceed with endovaginal exam following the transabdominal exam to visualize the endometrium and ovaries. Color and duplex Doppler ultrasound was utilized to evaluate blood flow to the ovaries. COMPARISON:  CT abdomen 01/12/2018, ultrasound pelvis 01/08/2018 FINDINGS: Uterus Measurements: 8.4 x 5 x 5.9 cm. No fibroids or other mass visualized. Endometrium Thickness: 5.7 mm.  No focal abnormality visualized. Right ovary Measurements: 2.7 x 1.4 x 2.3 cm. Normal appearance/no adnexal mass. Left ovary Measurements: 3.5 x 2.1 x 3.2 cm. 2.1 cm hypoechoic left ovarian mass with irregular walls without internal Doppler flow likely reflecting a corpus luteum cyst. 7.7 x 2.1 x 4.4  cm left adnexal soft tissue masslike area with increased Doppler flow concerning for a phlegmon which may be secondary to diverticulitis versus pelvic inflammatory disease. The abnormality abuts the ovary. Pulsed Doppler evaluation of both ovaries demonstrates normal low-resistance arterial and venous waveforms. Other findings Moderate amount of pelvic free fluid. IMPRESSION: 1. No ovarian torsion. 2. 7.7 x 2.1 x 4.4 cm left adnexal soft tissue masslike area with increased Doppler flow concerning for a phlegmon which may be secondary to diverticulitis versus pelvic inflammatory disease/tubo-ovarian abscess. Given that the phlegmon abuts the left ovary PID/TOA would be considered more likely. 3. Moderate amount of pelvic free fluid. Electronically Signed   By: Kathreen Devoid   On: 01/12/2018 12:57   US Pelvis Complete  Result Date: 01/12/2018 CLINICAL DATA:  Left elbow pain EXAM: TRANSABDOMINAL AND TRANSVAGINAL ULTRASOUND OF PELVIS DOPPLER ULTRASOUND OF OVARIES TECHNIQUE: Both transabdominal and transvaginal ultrasound examinations of the pelvis were performed. Transabdominal technique was performed for global imaging of the pelvis including uterus, ovaries, adnexal regions, and pelvic cul-de-sac. It was necessary to proceed with endovaginal exam following the transabdominal exam to visualize the endometrium and ovaries. Color and duplex Doppler ultrasound was utilized to evaluate blood flow to the ovaries. COMPARISON:  CT abdomen 01/12/2018, ultrasound pelvis 01/08/2018 FINDINGS: Uterus Measurements: 8.4 x 5 x 5.9 cm. No fibroids or other mass visualized. Endometrium Thickness: 5.7 mm.  No focal abnormality visualized. Right ovary Measurements: 2.7 x 1.4 x 2.3 cm. Normal appearance/no adnexal mass. Left ovary Measurements: 3.5 x 2.1 x 3.2 cm. 2.1 cm hypoechoic left ovarian mass with irregular walls without internal Doppler flow likely reflecting a corpus luteum cyst. 7.7 x 2.1 x 4.4 cm left adnexal soft tissue  masslike area with increased Doppler flow concerning for a phlegmon which may be secondary to diverticulitis versus pelvic inflammatory disease. The abnormality abuts the ovary. Pulsed Doppler evaluation of both ovaries demonstrates normal low-resistance arterial and venous waveforms. Other findings Moderate amount of pelvic free fluid. IMPRESSION: 1. No ovarian torsion. 2. 7.7 x 2.1 x 4.4 cm left adnexal soft tissue masslike area with increased Doppler flow concerning for a phlegmon which may be secondary to diverticulitis versus pelvic inflammatory disease/tubo-ovarian abscess. Given that the phlegmon abuts the left ovary PID/TOA would be considered more likely. 3. Moderate amount of pelvic free fluid. Electronically Signed   By: Kathreen Devoid   On: 01/12/2018 12:57   [place] Art/ven Flow Abd Pelv Doppler  Result Date: 01/12/2018 CLINICAL DATA:  Left elbow pain EXAM: TRANSABDOMINAL AND TRANSVAGINAL ULTRASOUND OF PELVIS DOPPLER ULTRASOUND OF OVARIES TECHNIQUE: Both transabdominal and transvaginal ultrasound examinations of the pelvis were performed. Transabdominal technique was performed for global imaging of the pelvis including uterus, ovaries, adnexal regions, and pelvic cul-de-sac. It was necessary  to proceed with endovaginal exam following the transabdominal exam to visualize the endometrium and ovaries. Color and duplex Doppler ultrasound was utilized to evaluate blood flow to the ovaries. COMPARISON:  CT abdomen 01/12/2018, ultrasound pelvis 01/08/2018 FINDINGS: Uterus Measurements: 8.4 x 5 x 5.9 cm. No fibroids or other mass visualized. Endometrium Thickness: 5.7 mm.  No focal abnormality visualized. Right ovary Measurements: 2.7 x 1.4 x 2.3 cm. Normal appearance/no adnexal mass. Left ovary Measurements: 3.5 x 2.1 x 3.2 cm. 2.1 cm hypoechoic left ovarian mass with irregular walls without internal Doppler flow likely reflecting a corpus luteum cyst. 7.7 x 2.1 x 4.4 cm left adnexal soft tissue masslike area  with increased Doppler flow concerning for a phlegmon which may be secondary to diverticulitis versus pelvic inflammatory disease. The abnormality abuts the ovary. Pulsed Doppler evaluation of both ovaries demonstrates normal low-resistance arterial and venous waveforms. Other findings Moderate amount of pelvic free fluid. IMPRESSION: 1. No ovarian torsion. 2. 7.7 x 2.1 x 4.4 cm left adnexal soft tissue masslike area with increased Doppler flow concerning for a phlegmon which may be secondary to diverticulitis versus pelvic inflammatory disease/tubo-ovarian abscess. Given that the phlegmon abuts the left ovary PID/TOA would be considered more likely. 3. Moderate amount of pelvic free fluid. Electronically Signed   By: Kathreen Devoid   On: 01/12/2018 12:57   Ct Renal Stone Study  Result Date: 01/12/2018 CLINICAL DATA:  Left flank and left lower quadrant pain. EXAM: CT ABDOMEN AND PELVIS WITHOUT CONTRAST TECHNIQUE: Multidetector CT imaging of the abdomen and pelvis was performed following the standard protocol without IV contrast. COMPARISON:  Pelvic ultrasound 01/08/2018 FINDINGS: Lower chest: Lung bases are clear. No effusions. Heart is normal size. Hepatobiliary: No focal liver abnormality is seen. Status post cholecystectomy. No biliary dilatation. Pancreas: No focal abnormality or ductal dilatation. Spleen: No focal abnormality.  Normal size. Adrenals/Urinary Tract: No adrenal abnormality. No focal renal abnormality. No stones or hydronephrosis. Urinary bladder is unremarkable. Stomach/Bowel: There is inflammation noted in the left lower quadrant adjacent to the mid sigmoid colon and the left ovary. It is unclear if this is arising from the left ovary or from the sigmoid colon. I see no definite diverticula in the area. No evidence of bowel obstruction. Appendix is normal. Vascular/Lymphatic: No evidence of aneurysm or adenopathy. Reproductive: Uterus and right adnexa unremarkable. There is fullness of the left  ovary with surrounding inflammation around the left ovary and the adjacent mid sigmoid colon. It is unclear if this is arising from the sigmoid colon or the left ovary. Other: Trace free fluid in the pelvis.  No free air. Musculoskeletal: No acute bony abnormality. IMPRESSION: Abnormal inflammatory stranding in the left lower quadrant adjacent to the mid sigmoid colon and the left ovary. The left ovary appears prominent as well. Differential considerations would include colitis/diverticulitis of the sigmoid colon, pelvic inflammatory disease, or ovarian torsion. The left ovary appeared normal on recent pelvic ultrasound, but repeat study with Doppler may be beneficial to exclude ovarian origin. Electronically Signed   By: Rolm Baptise M.D.   On: 01/12/2018 10:53    Review of Systems  All other systems reviewed and are negative.  Blood pressure 133/82, pulse (!) 118, temperature 98.3 F (36.8 C), temperature source Oral, resp. rate 18, height 5' 3"  (1.6 m), weight 77.6 kg, last menstrual period 12/27/2017, SpO2 100 %. Physical Exam  Constitutional: She is oriented to person, place, and time. She appears well-developed and well-nourished. No distress.  HENT:  Head:  Normocephalic and atraumatic.  Eyes: Pupils are equal, round, and reactive to light. Conjunctivae and EOM are normal.  Neck: Normal range of motion. Neck supple. No tracheal deviation present. No thyromegaly present.  Cardiovascular: Normal rate, regular rhythm and intact distal pulses.  Respiratory: Effort normal and breath sounds normal. No respiratory distress.  GI: Soft. She exhibits no distension. There is tenderness.  Tender across the lower abdomen most prominent in the suprapubic and left lower quadrant with voluntary guarding  Musculoskeletal: Normal range of motion. She exhibits no edema.  Neurological: She is alert and oriented to person, place, and time.  Skin: Skin is warm and dry. She is not diaphoretic.  Psychiatric: She  has a normal mood and affect. Her behavior is normal. Judgment and thought content normal.    Assessment/Plan: One-week history of lower abdominal pain, inflammation/phlegmon noted on CT (noncontrast...) and ultrasound, no history of diverticulosis, ultrasound read leans more towards PID etiology. In either case there does not appear to be any drainable abscess at this time. Would recommend empiric IV antibiotics, pain control, gentle fluid resuscitation. Okay for patient to have sips of clears at this time. Serial abdominal exams and labs. If there is failure to improve would recommend repeating a CT scan with contrast.   Will continue to follow.    [facility]

## 2018-01-12 NOTE — Progress Notes (Signed)
Pharmacy Antibiotic Note  Megan Gibson is a 47 y.o. female admitted on 01/12/2018 with possible phlegmon secondary to diverticulitis vs PID/tubo-ovarian abscess.  Pharmacy has been consulted for Zosyn dosing; patient already on doxycycline per MD  Plan:  Zosyn 3.375 g IV given once over 30 minutes, then every 8 hrs by 4-hr infusion  Continue doxycycline per MD, dosing appropriate  Pharmacy will sign off, following peripherally for culture results or dose adjustments. Please reconsult if a change in clinical status warrants re-evaluation of dosage.  Height: 5\' 3"  (160 cm) Weight: 171 lb (77.6 kg) IBW/kg (Calculated) : 52.4  Temp (24hrs), Avg:98.4 F (36.9 C), Min:98.3 F (36.8 C), Max:98.5 F (36.9 C)  Recent Labs  Lab 01/12/18 0923 01/12/18 1342  WBC 16.4*  --   CREATININE 0.72  --   LATICACIDVEN  --  1.79    Estimated Creatinine Clearance: 87.6 mL/min (by C-G formula based on SCr of 0.72 mg/dL).    No Known Allergies    Thank you for allowing pharmacy to be a part of this patient's care.  Reuel Boom, PharmD, BCPS [phone] 01/12/2018, 6:24 PM

## 2018-01-12 NOTE — Progress Notes (Signed)
Transfer Request for patient by Dr. Malvin Johns  The patient is a 48 year old female with a past medical history significant for anxiety, seasonal allergies, history of abnormal cervical Pap smear, history of HPV positive, insomnia, comorbidities who presents to [facility] with a chief complaint of lower abdominal pain left lower quadrant abdomen going for 6 days.  Patient described as a waxing and waning and initially thought was constipation.  CT of the abdomen pelvis was done which revealed some inflammatory changes in the left lower quadrant adjacent to the mid sigmoid colon and left ovary.  Differential considered worst colitis/diverticulitis of the sigmoid colon PID or ovarian torsion.  Ovarian torsion was ruled out however there is a 7.7 x 2.1 x 4.4 cm left adnexal soft tissue masslike area with increased Doppler flow concerning for phlegmon which was likely secondary to diverticulitis versus PID/tubo-ovarian abscess.  Radiologist felt like it was more consistent with diverticulitis with a phlegmon given that she had minimal pain on pelvic examination.  General surgery was consulted and will follow the patient.  Patient was accepted to telemetry bed as she is still little tachycardic and has elevated WBC.

## 2018-01-12 NOTE — ED Provider Notes (Signed)
[facility] EMERGENCY DEPARTMENT Provider Note   CSN: [id number] Arrival date & time: 01/12/18  3893     History   Chief Complaint Chief Complaint  Patient presents with  . Abdominal Pain    HPI Megan Gibson is a 47 y.o. female.  Patient is a 47 year old female who presents with abdominal pain.  She reports a 6-day history of pain in her low left lower abdomen.  She states it has been waxing and waning in intensity and initially went away for a few days but has come back over the last 2 days and now is worsening.  She denies any radiation to her back.  She does report associated constipation although she had a large bowel movement this morning but did not have improvement in symptoms.  She denies any nausea or vomiting.  No known fevers.  She did notice some blood on her toilet paper when she wiped which she thinks is coming from her rectum.  This happened after she had a large bowel movement.  She denies any increased pain on urination.  She was seen at urgent care on August 15 with the symptoms and had a pelvic ultrasound which was negative.  Her urine culture was negative as well.     Past Medical History:  Diagnosis Date  . Anxiety   . Family history of breast cancer in mother   . Family history of breast cancer in mother   . Family history of colon cancer in mother   . Family history of ovarian cancer   . Family history of pancreatic cancer   . History of abnormal cervical Pap smear   . Seasonal allergies     Patient Active Problem List   Diagnosis Date Noted  . Family history of ovarian cancer   . Family history of breast cancer in mother   . Family history of colon cancer in mother   . Family history of pancreatic cancer   . Anxiety 05/18/2014  . HPV test positive 05/18/2014  . Insomnia 05/18/2014  . Allergic rhinitis 05/18/2014  . FHx: breast cancer in first degree relative  mother age 72 05/18/2014  . FH: ovarian cancer in first degree relative   mother 05/18/2014  . FH: colon cancer in relative <60 years old  mother age 36's 05/18/2014    Past Surgical History:  Procedure Laterality Date  . ABLATION  11/2011  . CESAREAN SECTION    . CHOLECYSTECTOMY    . TUBAL LIGATION       OB History   None      Home Medications    Prior to Admission medications   Medication Sig Start Date End Date Taking? Authorizing Provider  buPROPion (WELLBUTRIN XL) 150 MG 24 hr tablet Take 1 tablet (150 mg total) by mouth every morning. 09/24/17   Emeterio Reeve, DO  fexofenadine (ALLEGRA) 180 MG tablet Take 180 mg by mouth daily.    [provider]  fluticasone (FLONASE) 50 MCG/ACT nasal spray One spray nostril daily for 2 weeks then stop   Give generic 10/05/14   Schoenhoff, Altamese Cabal, MD  pseudoephedrine (SUDAFED) 30 MG tablet Take 30 mg by mouth every 4 (four) hours as needed for congestion.    [provider]    Family History Family History  Problem Relation Age of Onset  . Breast cancer Mother        dx in her 24s  . Ovarian cancer Mother 38  . Colon cancer Mother 53  .  Skin cancer Mother   . Heart disease Father   . Diabetes Father   . High blood pressure Father   . Heart attack Father   . Diabetes type II Father   . Diabetes Maternal Grandmother   . Heart disease Maternal Grandmother   . Pancreatic cancer Maternal Grandfather        dx and died in his 5s  . Ovarian cancer Maternal Aunt        dx in her 48s  . Lung cancer Maternal Uncle   . Multiple sclerosis Maternal Aunt   . Prostate cancer Maternal Uncle        metastized to liver  . Breast cancer Cousin        maternal first cousin dx in her 80s  . Cancer Cousin        maternal first cousin dx with cancer NOS, causing her to have a TAH-BSO in her 26s    Social History Social History   Tobacco Use  . Smoking status: Never Smoker  . Smokeless tobacco: Never Used  Substance Use Topics  . Alcohol use: Yes    Alcohol/week: 0.0 standard drinks     Comment: 5/month  . Drug use: No     Allergies   Patient has no known allergies.   Review of Systems Review of Systems  Constitutional: Negative for chills, diaphoresis, fatigue and fever.  HENT: Negative for congestion, rhinorrhea and sneezing.   Eyes: Negative.   Respiratory: Negative for cough, chest tightness and shortness of breath.   Cardiovascular: Negative for chest pain and leg swelling.  Gastrointestinal: Positive for abdominal pain and constipation. Negative for blood in stool, diarrhea, nausea and vomiting.  Genitourinary: Negative for difficulty urinating, flank pain, frequency and hematuria.  Musculoskeletal: Negative for arthralgias and back pain.  Skin: Negative for rash.  Neurological: Negative for dizziness, speech difficulty, weakness, numbness and headaches.     Physical Exam Updated Vital Signs BP 133/82 (BP Location: Left Arm)   Pulse (!) 118   Temp 98.3 F (36.8 C) (Oral)   Resp 18   Ht 5\' 3"  (1.6 m)   Wt 77.6 kg   LMP 12/27/2017   SpO2 100%   BMI 30.29 kg/m   Physical Exam  Constitutional: She is oriented to person, place, and time. She appears well-developed and well-nourished.  HENT:  Head: Normocephalic and atraumatic.  Eyes: Pupils are equal, round, and reactive to light.  Neck: Normal range of motion. Neck supple.  Cardiovascular: Regular rhythm and normal heart sounds. Tachycardia present.  Pulmonary/Chest: Effort normal and breath sounds normal. No respiratory distress. She has no wheezes. She has no rales. She exhibits no tenderness.  Abdominal: Soft. Bowel sounds are normal. There is tenderness in the suprapubic area and left lower quadrant. There is no rebound and no guarding.  Genitourinary:  Genitourinary Comments: Patient has a small amount of dark vaginal bleeding, there is no cervical motion tenderness, there is some left adnexal tenderness, there is no purulent discharge noted  Musculoskeletal: Normal range of motion. She  exhibits no edema.  Lymphadenopathy:    She has no cervical adenopathy.  Neurological: She is alert and oriented to person, place, and time.  Skin: Skin is warm and dry. No rash noted.  Psychiatric: She has a normal mood and affect.     ED Treatments / Results  Labs (all labs ordered are listed, but only abnormal results are displayed) Labs Reviewed  WET PREP, GENITAL - Abnormal; Notable for the  following components:      Result Value   WBC, Wet Prep HPF POC MANY (*)    All other components within normal limits  COMPREHENSIVE METABOLIC PANEL - Abnormal; Notable for the following components:   Glucose, Bld 126 (*)    Calcium 8.8 (*)    ALT 46 (*)    All other components within normal limits  CBC WITH DIFFERENTIAL/PLATELET - Abnormal; Notable for the following components:   WBC 16.4 (*)    Neutro Abs 13.0 (*)    Monocytes Absolute 1.8 (*)    All other components within normal limits  URINALYSIS, ROUTINE W REFLEX MICROSCOPIC - Abnormal; Notable for the following components:   Specific Gravity, Urine <1.005 (*)    Hgb urine dipstick LARGE (*)    Leukocytes, UA MODERATE (*)    All other components within normal limits  URINALYSIS, MICROSCOPIC (REFLEX) - Abnormal; Notable for the following components:   Bacteria, UA MANY (*)    All other components within normal limits  URINE CULTURE  LIPASE, BLOOD  PREGNANCY, URINE  I-STAT CG4 LACTIC ACID, ED  GC/CHLAMYDIA PROBE AMP ([place]) NOT AT [facility]    EKG None  Radiology US Transvaginal Non-ob  Result Date: 01/12/2018 CLINICAL DATA:  Left elbow pain EXAM: TRANSABDOMINAL AND TRANSVAGINAL ULTRASOUND OF PELVIS DOPPLER ULTRASOUND OF OVARIES TECHNIQUE: Both transabdominal and transvaginal ultrasound examinations of the pelvis were performed. Transabdominal technique was performed for global imaging of the pelvis including uterus, ovaries, adnexal regions, and pelvic cul-de-sac. It was necessary to proceed with endovaginal exam  following the transabdominal exam to visualize the endometrium and ovaries. Color and duplex Doppler ultrasound was utilized to evaluate blood flow to the ovaries. COMPARISON:  CT abdomen 01/12/2018, ultrasound pelvis 01/08/2018 FINDINGS: Uterus Measurements: 8.4 x 5 x 5.9 cm. No fibroids or other mass visualized. Endometrium Thickness: 5.7 mm.  No focal abnormality visualized. Right ovary Measurements: 2.7 x 1.4 x 2.3 cm. Normal appearance/no adnexal mass. Left ovary Measurements: 3.5 x 2.1 x 3.2 cm. 2.1 cm hypoechoic left ovarian mass with irregular walls without internal Doppler flow likely reflecting a corpus luteum cyst. 7.7 x 2.1 x 4.4 cm left adnexal soft tissue masslike area with increased Doppler flow concerning for a phlegmon which may be secondary to diverticulitis versus pelvic inflammatory disease. The abnormality abuts the ovary. Pulsed Doppler evaluation of both ovaries demonstrates normal low-resistance arterial and venous waveforms. Other findings Moderate amount of pelvic free fluid. IMPRESSION: 1. No ovarian torsion. 2. 7.7 x 2.1 x 4.4 cm left adnexal soft tissue masslike area with increased Doppler flow concerning for a phlegmon which may be secondary to diverticulitis versus pelvic inflammatory disease/tubo-ovarian abscess. Given that the phlegmon abuts the left ovary PID/TOA would be considered more likely. 3. Moderate amount of pelvic free fluid. Electronically Signed   By: Kathreen Devoid   On: 01/12/2018 12:57   US Pelvis Complete  Result Date: 01/12/2018 CLINICAL DATA:  Left elbow pain EXAM: TRANSABDOMINAL AND TRANSVAGINAL ULTRASOUND OF PELVIS DOPPLER ULTRASOUND OF OVARIES TECHNIQUE: Both transabdominal and transvaginal ultrasound examinations of the pelvis were performed. Transabdominal technique was performed for global imaging of the pelvis including uterus, ovaries, adnexal regions, and pelvic cul-de-sac. It was necessary to proceed with endovaginal exam following the transabdominal  exam to visualize the endometrium and ovaries. Color and duplex Doppler ultrasound was utilized to evaluate blood flow to the ovaries. COMPARISON:  CT abdomen 01/12/2018, ultrasound pelvis 01/08/2018 FINDINGS: Uterus Measurements: 8.4 x 5 x 5.9 cm. No fibroids  or other mass visualized. Endometrium Thickness: 5.7 mm.  No focal abnormality visualized. Right ovary Measurements: 2.7 x 1.4 x 2.3 cm. Normal appearance/no adnexal mass. Left ovary Measurements: 3.5 x 2.1 x 3.2 cm. 2.1 cm hypoechoic left ovarian mass with irregular walls without internal Doppler flow likely reflecting a corpus luteum cyst. 7.7 x 2.1 x 4.4 cm left adnexal soft tissue masslike area with increased Doppler flow concerning for a phlegmon which may be secondary to diverticulitis versus pelvic inflammatory disease. The abnormality abuts the ovary. Pulsed Doppler evaluation of both ovaries demonstrates normal low-resistance arterial and venous waveforms. Other findings Moderate amount of pelvic free fluid. IMPRESSION: 1. No ovarian torsion. 2. 7.7 x 2.1 x 4.4 cm left adnexal soft tissue masslike area with increased Doppler flow concerning for a phlegmon which may be secondary to diverticulitis versus pelvic inflammatory disease/tubo-ovarian abscess. Given that the phlegmon abuts the left ovary PID/TOA would be considered more likely. 3. Moderate amount of pelvic free fluid. Electronically Signed   By: Kathreen Devoid   On: 01/12/2018 12:57   [place] Art/ven Flow Abd Pelv Doppler  Result Date: 01/12/2018 CLINICAL DATA:  Left elbow pain EXAM: TRANSABDOMINAL AND TRANSVAGINAL ULTRASOUND OF PELVIS DOPPLER ULTRASOUND OF OVARIES TECHNIQUE: Both transabdominal and transvaginal ultrasound examinations of the pelvis were performed. Transabdominal technique was performed for global imaging of the pelvis including uterus, ovaries, adnexal regions, and pelvic cul-de-sac. It was necessary to proceed with endovaginal exam following the transabdominal exam to visualize  the endometrium and ovaries. Color and duplex Doppler ultrasound was utilized to evaluate blood flow to the ovaries. COMPARISON:  CT abdomen 01/12/2018, ultrasound pelvis 01/08/2018 FINDINGS: Uterus Measurements: 8.4 x 5 x 5.9 cm. No fibroids or other mass visualized. Endometrium Thickness: 5.7 mm.  No focal abnormality visualized. Right ovary Measurements: 2.7 x 1.4 x 2.3 cm. Normal appearance/no adnexal mass. Left ovary Measurements: 3.5 x 2.1 x 3.2 cm. 2.1 cm hypoechoic left ovarian mass with irregular walls without internal Doppler flow likely reflecting a corpus luteum cyst. 7.7 x 2.1 x 4.4 cm left adnexal soft tissue masslike area with increased Doppler flow concerning for a phlegmon which may be secondary to diverticulitis versus pelvic inflammatory disease. The abnormality abuts the ovary. Pulsed Doppler evaluation of both ovaries demonstrates normal low-resistance arterial and venous waveforms. Other findings Moderate amount of pelvic free fluid. IMPRESSION: 1. No ovarian torsion. 2. 7.7 x 2.1 x 4.4 cm left adnexal soft tissue masslike area with increased Doppler flow concerning for a phlegmon which may be secondary to diverticulitis versus pelvic inflammatory disease/tubo-ovarian abscess. Given that the phlegmon abuts the left ovary PID/TOA would be considered more likely. 3. Moderate amount of pelvic free fluid. Electronically Signed   By: Kathreen Devoid   On: 01/12/2018 12:57   Ct Renal Stone Study  Result Date: 01/12/2018 CLINICAL DATA:  Left flank and left lower quadrant pain. EXAM: CT ABDOMEN AND PELVIS WITHOUT CONTRAST TECHNIQUE: Multidetector CT imaging of the abdomen and pelvis was performed following the standard protocol without IV contrast. COMPARISON:  Pelvic ultrasound 01/08/2018 FINDINGS: Lower chest: Lung bases are clear. No effusions. Heart is normal size. Hepatobiliary: No focal liver abnormality is seen. Status post cholecystectomy. No biliary dilatation. Pancreas: No focal abnormality  or ductal dilatation. Spleen: No focal abnormality.  Normal size. Adrenals/Urinary Tract: No adrenal abnormality. No focal renal abnormality. No stones or hydronephrosis. Urinary bladder is unremarkable. Stomach/Bowel: There is inflammation noted in the left lower quadrant adjacent to the mid sigmoid colon and the left  ovary. It is unclear if this is arising from the left ovary or from the sigmoid colon. I see no definite diverticula in the area. No evidence of bowel obstruction. Appendix is normal. Vascular/Lymphatic: No evidence of aneurysm or adenopathy. Reproductive: Uterus and right adnexa unremarkable. There is fullness of the left ovary with surrounding inflammation around the left ovary and the adjacent mid sigmoid colon. It is unclear if this is arising from the sigmoid colon or the left ovary. Other: Trace free fluid in the pelvis.  No free air. Musculoskeletal: No acute bony abnormality. IMPRESSION: Abnormal inflammatory stranding in the left lower quadrant adjacent to the mid sigmoid colon and the left ovary. The left ovary appears prominent as well. Differential considerations would include colitis/diverticulitis of the sigmoid colon, pelvic inflammatory disease, or ovarian torsion. The left ovary appeared normal on recent pelvic ultrasound, but repeat study with Doppler may be beneficial to exclude ovarian origin. Electronically Signed   By: Rolm Baptise M.D.   On: 01/12/2018 10:53    Procedures Procedures (including critical care time)  Medications Ordered in ED Medications  0.9 %  sodium chloride infusion (500 mLs Intravenous New Bag/Given 01/12/18 1343)  piperacillin-tazobactam (ZOSYN) IVPB 3.375 g (3.375 g Intravenous New Bag/Given 01/12/18 1344)  sodium chloride 0.9 % bolus 1,000 mL ( Intravenous Stopped 01/12/18 1031)  cefTRIAXone (ROCEPHIN) 1 g in sodium chloride 0.9 % 100 mL IVPB ( Intravenous Stopped 01/12/18 1159)  fentaNYL (SUBLIMAZE) injection 50 mcg (50 mcg Intravenous Given  01/12/18 1116)  fentaNYL (SUBLIMAZE) injection 50 mcg (50 mcg Intravenous Given 01/12/18 1341)     Initial Impression / Assessment and Plan / ED Course  I have reviewed the triage vital signs and the nursing notes.  Pertinent labs & imaging results that were available during my care of the patient were reviewed by me and considered in my medical decision making (see chart for details).     Patient is a 47 year old female who presents with a 6-day history of worsening pain in her left lower abdomen.  It was unclear exactly with the etiology of the symptoms are.  CT scan showed inflammatory changes in the left lower pelvic area.  There is concern for ovarian torsion or other ovarian etiology.  A pelvic ultrasound with Doppler was performed which shows no evidence of torsion.  There is evidence of a phlegmon adjacent to the ovary and the colon.  Her pelvic exam is unremarkable.  She has a small amount of vaginal bleeding but no purulent discharge, no cervical motion tenderness.  Her white count is 16,000.  She has no fever.  Her lactate is normal.  She was given IV Zosyn.  She still remains tachycardic after 1 L IV fluids.  Given this, I feel that she needs to be admitted for infection, likely diverticulitis.  I spoke with Dr. Windle Guard with general surgery who advises that surgery will follow the patient but requests medicine admission.  I spoke with Dr. Alfredia Ferguson who will admit the pt.  Final Clinical Impressions(s) / ED Diagnoses   Final diagnoses:  Diverticulitis    ED Discharge Orders    None       Malvin Johns, MD 01/12/18 1409

## 2018-01-12 NOTE — ED Notes (Signed)
Patient transported to Ultrasound 

## 2018-01-12 NOTE — H&P (Signed)
[facility] History and Physical   Patient: Megan Gibson VZC:[id number]   PCP: Emeterio Reeve, DO DOB: July 05, 1970   DOA: 01/12/2018   DOS: 01/12/2018   DOS: the patient was seen and examined on 01/12/2018  Patient coming from: The patient is coming from home.  Chief Complaint: abdominal pain  HPI: Megan Gibson is a 47 y.o. female with Past medical history of anxiety. Patient presented with complaints of abdominal pain located on the left side ongoing for last 3 days along with night sweats.  No fever no chills no nausea no vomiting no diarrhea no constipation.  She did have one loose watery BM today.  No prior episode of vaginal infection or diarrheal illness. No procedures reported as well.  ED Course: CT abdomen was positive for possible phlegmon.  Ultrasound abdomen was performed and patient was referred for admission.  At her baseline ambulates without any support And is independent for most of her ADL; manages her medication on her own.  Review of Systems: as mentioned in the history of present illness.  All other systems reviewed and are negative.  Past Medical History:  Diagnosis Date  . Anxiety   . Family history of breast cancer in mother   . Family history of breast cancer in mother   . Family history of colon cancer in mother   . Family history of ovarian cancer   . Family history of pancreatic cancer   . History of abnormal cervical Pap smear   . Seasonal allergies    Past Surgical History:  Procedure Laterality Date  . ABLATION  11/2011  . CESAREAN SECTION    . CHOLECYSTECTOMY    . TUBAL LIGATION     Social History:  reports that she has never smoked. She has never used smokeless tobacco. She reports that she drinks alcohol. She reports that she does not use drugs.  No Known Allergies   Family History  Problem Relation Age of Onset  . Breast cancer Mother        dx in her 73s  . Ovarian cancer Mother 48  . Colon cancer Mother 55  . Skin cancer  Mother   . Heart disease Father   . Diabetes Father   . High blood pressure Father   . Heart attack Father   . Diabetes type II Father   . Diabetes Maternal Grandmother   . Heart disease Maternal Grandmother   . Pancreatic cancer Maternal Grandfather        dx and died in his 66s  . Ovarian cancer Maternal Aunt        dx in her 25s  . Lung cancer Maternal Uncle   . Multiple sclerosis Maternal Aunt   . Prostate cancer Maternal Uncle        metastized to liver  . Breast cancer Cousin        maternal first cousin dx in her 3s  . Cancer Cousin        maternal first cousin dx with cancer NOS, causing her to have a TAH-BSO in her 70s     Prior to Admission medications   Medication Sig Start Date End Date Taking? Authorizing Provider  fexofenadine (ALLEGRA) 180 MG tablet Take 180 mg by mouth daily as needed for allergies.    Yes [provider]  fluticasone (FLONASE) 50 MCG/ACT nasal spray One spray nostril daily for 2 weeks then stop   Give generic 10/05/14  Yes Schoenhoff, Altamese Cabal, MD  pseudoephedrine (SUDAFED) 30  MG tablet Take 30 mg by mouth every 4 (four) hours as needed for congestion.   Yes [provider]  buPROPion (WELLBUTRIN XL) 150 MG 24 hr tablet Take 1 tablet (150 mg total) by mouth every morning. 09/24/17   Emeterio Reeve, DO    Physical Exam: Vitals:   01/12/18 1110 01/12/18 1314 01/12/18 1412 01/12/18 1535  BP: 125/82 133/82 134/86 132/81  Pulse: 99 (!) 118 (!) 117 (!) 119  Resp: 16 18 16 16   Temp:   98.5 F (36.9 C)   TempSrc:   Oral   SpO2: 100% 100% 100% 98%  Weight:      Height:        General: Alert, Awake and Oriented to Time, Place and Person. Appear in mild distress, affect appropriate Eyes: PERRL, Conjunctiva normal ENT: Oral Mucosa clear moist. Neck: no JVD, no Abnormal Mass Or lumps Cardiovascular: S1 and S2 Present, no Murmur, Peripheral Pulses Present Respiratory: normal respiratory effort, Bilateral Air entry equal and  Decreased, no use of accessory muscle, Clear to Auscultation, no Crackles, no wheezes Abdomen: Bowel Sound present, Soft and moderate tenderness, no hernia Skin: no redness, no Rash, no induration Extremities: no Pedal edema, no calf tenderness Neurologic: Grossly no focal neuro deficit. Bilaterally Equal motor strength  Labs on Admission:  CBC: Recent Labs  Lab 01/12/18 0923  WBC 16.4*  NEUTROABS 13.0*  HGB 14.2  HCT 41.1  MCV 81.1  PLT 850   Basic Metabolic Panel: Recent Labs  Lab 01/12/18 0923  NA 136  K 3.7  CL 99  CO2 25  GLUCOSE 126*  BUN 7  CREATININE 0.72  CALCIUM 8.8*   GFR: Estimated Creatinine Clearance: 87.6 mL/min (by C-G formula based on SCr of 0.72 mg/dL). Liver Function Tests: Recent Labs  Lab 01/12/18 0923  AST 37  ALT 46*  ALKPHOS 91  BILITOT 1.0  PROT 8.0  ALBUMIN 3.8   Recent Labs  Lab 01/12/18 0923  LIPASE 23   No results for input(s): AMMONIA in the last 168 hours. Coagulation Profile: No results for input(s): INR, PROTIME in the last 168 hours. Cardiac Enzymes: No results for input(s): CKTOTAL, CKMB, CKMBINDEX, TROPONINI in the last 168 hours. BNP (last 3 results) No results for input(s): PROBNP in the last 8760 hours. HbA1C: No results for input(s): HGBA1C in the last 72 hours. CBG: No results for input(s): GLUCAP in the last 168 hours. Lipid Profile: No results for input(s): CHOL, HDL, LDLCALC, TRIG, CHOLHDL, LDLDIRECT in the last 72 hours. Thyroid Function Tests: No results for input(s): TSH, T4TOTAL, FREET4, T3FREE, THYROIDAB in the last 72 hours. Anemia Panel: No results for input(s): VITAMINB12, FOLATE, FERRITIN, TIBC, IRON, RETICCTPCT in the last 72 hours. Urine analysis:    Component Value Date/Time   COLORURINE YELLOW 01/12/2018 [place] 01/12/2018 0956   LABSPEC <1.005 (L) 01/12/2018 0956   PHURINE 6.5 01/12/2018 0956   GLUCOSEU NEGATIVE 01/12/2018 0956   HGBUR LARGE (A) 01/12/2018 0956    BILIRUBINUR NEGATIVE 01/12/2018 0956   BILIRUBINUR negative 01/08/2018 0857   BILIRUBINUR neg 10/05/2014 0920   KETONESUR NEGATIVE 01/12/2018 0956   PROTEINUR NEGATIVE 01/12/2018 0956   UROBILINOGEN 0.2 01/08/2018 0857   NITRITE NEGATIVE 01/12/2018 0956   LEUKOCYTESUR MODERATE (A) 01/12/2018 0956    Radiological Exams on Admission: US Transvaginal Non-ob  Result Date: 01/12/2018 CLINICAL DATA:  Left elbow pain EXAM: TRANSABDOMINAL AND TRANSVAGINAL ULTRASOUND OF PELVIS DOPPLER ULTRASOUND OF OVARIES TECHNIQUE: Both transabdominal and transvaginal ultrasound examinations  of the pelvis were performed. Transabdominal technique was performed for global imaging of the pelvis including uterus, ovaries, adnexal regions, and pelvic cul-de-sac. It was necessary to proceed with endovaginal exam following the transabdominal exam to visualize the endometrium and ovaries. Color and duplex Doppler ultrasound was utilized to evaluate blood flow to the ovaries. COMPARISON:  CT abdomen 01/12/2018, ultrasound pelvis 01/08/2018 FINDINGS: Uterus Measurements: 8.4 x 5 x 5.9 cm. No fibroids or other mass visualized. Endometrium Thickness: 5.7 mm.  No focal abnormality visualized. Right ovary Measurements: 2.7 x 1.4 x 2.3 cm. Normal appearance/no adnexal mass. Left ovary Measurements: 3.5 x 2.1 x 3.2 cm. 2.1 cm hypoechoic left ovarian mass with irregular walls without internal Doppler flow likely reflecting a corpus luteum cyst. 7.7 x 2.1 x 4.4 cm left adnexal soft tissue masslike area with increased Doppler flow concerning for a phlegmon which may be secondary to diverticulitis versus pelvic inflammatory disease. The abnormality abuts the ovary. Pulsed Doppler evaluation of both ovaries demonstrates normal low-resistance arterial and venous waveforms. Other findings Moderate amount of pelvic free fluid. IMPRESSION: 1. No ovarian torsion. 2. 7.7 x 2.1 x 4.4 cm left adnexal soft tissue masslike area with increased Doppler flow  concerning for a phlegmon which may be secondary to diverticulitis versus pelvic inflammatory disease/tubo-ovarian abscess. Given that the phlegmon abuts the left ovary PID/TOA would be considered more likely. 3. Moderate amount of pelvic free fluid. Electronically Signed   By: Kathreen Devoid   On: 01/12/2018 12:57   US Pelvis Complete  Result Date: 01/12/2018 CLINICAL DATA:  Left elbow pain EXAM: TRANSABDOMINAL AND TRANSVAGINAL ULTRASOUND OF PELVIS DOPPLER ULTRASOUND OF OVARIES TECHNIQUE: Both transabdominal and transvaginal ultrasound examinations of the pelvis were performed. Transabdominal technique was performed for global imaging of the pelvis including uterus, ovaries, adnexal regions, and pelvic cul-de-sac. It was necessary to proceed with endovaginal exam following the transabdominal exam to visualize the endometrium and ovaries. Color and duplex Doppler ultrasound was utilized to evaluate blood flow to the ovaries. COMPARISON:  CT abdomen 01/12/2018, ultrasound pelvis 01/08/2018 FINDINGS: Uterus Measurements: 8.4 x 5 x 5.9 cm. No fibroids or other mass visualized. Endometrium Thickness: 5.7 mm.  No focal abnormality visualized. Right ovary Measurements: 2.7 x 1.4 x 2.3 cm. Normal appearance/no adnexal mass. Left ovary Measurements: 3.5 x 2.1 x 3.2 cm. 2.1 cm hypoechoic left ovarian mass with irregular walls without internal Doppler flow likely reflecting a corpus luteum cyst. 7.7 x 2.1 x 4.4 cm left adnexal soft tissue masslike area with increased Doppler flow concerning for a phlegmon which may be secondary to diverticulitis versus pelvic inflammatory disease. The abnormality abuts the ovary. Pulsed Doppler evaluation of both ovaries demonstrates normal low-resistance arterial and venous waveforms. Other findings Moderate amount of pelvic free fluid. IMPRESSION: 1. No ovarian torsion. 2. 7.7 x 2.1 x 4.4 cm left adnexal soft tissue masslike area with increased Doppler flow concerning for a phlegmon which  may be secondary to diverticulitis versus pelvic inflammatory disease/tubo-ovarian abscess. Given that the phlegmon abuts the left ovary PID/TOA would be considered more likely. 3. Moderate amount of pelvic free fluid. Electronically Signed   By: Kathreen Devoid   On: 01/12/2018 12:57   [place] Art/ven Flow Abd Pelv Doppler  Result Date: 01/12/2018 CLINICAL DATA:  Left elbow pain EXAM: TRANSABDOMINAL AND TRANSVAGINAL ULTRASOUND OF PELVIS DOPPLER ULTRASOUND OF OVARIES TECHNIQUE: Both transabdominal and transvaginal ultrasound examinations of the pelvis were performed. Transabdominal technique was performed for global imaging of the pelvis including uterus, ovaries, adnexal  regions, and pelvic cul-de-sac. It was necessary to proceed with endovaginal exam following the transabdominal exam to visualize the endometrium and ovaries. Color and duplex Doppler ultrasound was utilized to evaluate blood flow to the ovaries. COMPARISON:  CT abdomen 01/12/2018, ultrasound pelvis 01/08/2018 FINDINGS: Uterus Measurements: 8.4 x 5 x 5.9 cm. No fibroids or other mass visualized. Endometrium Thickness: 5.7 mm.  No focal abnormality visualized. Right ovary Measurements: 2.7 x 1.4 x 2.3 cm. Normal appearance/no adnexal mass. Left ovary Measurements: 3.5 x 2.1 x 3.2 cm. 2.1 cm hypoechoic left ovarian mass with irregular walls without internal Doppler flow likely reflecting a corpus luteum cyst. 7.7 x 2.1 x 4.4 cm left adnexal soft tissue masslike area with increased Doppler flow concerning for a phlegmon which may be secondary to diverticulitis versus pelvic inflammatory disease. The abnormality abuts the ovary. Pulsed Doppler evaluation of both ovaries demonstrates normal low-resistance arterial and venous waveforms. Other findings Moderate amount of pelvic free fluid. IMPRESSION: 1. No ovarian torsion. 2. 7.7 x 2.1 x 4.4 cm left adnexal soft tissue masslike area with increased Doppler flow concerning for a phlegmon which may be secondary  to diverticulitis versus pelvic inflammatory disease/tubo-ovarian abscess. Given that the phlegmon abuts the left ovary PID/TOA would be considered more likely. 3. Moderate amount of pelvic free fluid. Electronically Signed   By: Kathreen Devoid   On: 01/12/2018 12:57   Ct Renal Stone Study  Result Date: 01/12/2018 CLINICAL DATA:  Left flank and left lower quadrant pain. EXAM: CT ABDOMEN AND PELVIS WITHOUT CONTRAST TECHNIQUE: Multidetector CT imaging of the abdomen and pelvis was performed following the standard protocol without IV contrast. COMPARISON:  Pelvic ultrasound 01/08/2018 FINDINGS: Lower chest: Lung bases are clear. No effusions. Heart is normal size. Hepatobiliary: No focal liver abnormality is seen. Status post cholecystectomy. No biliary dilatation. Pancreas: No focal abnormality or ductal dilatation. Spleen: No focal abnormality.  Normal size. Adrenals/Urinary Tract: No adrenal abnormality. No focal renal abnormality. No stones or hydronephrosis. Urinary bladder is unremarkable. Stomach/Bowel: There is inflammation noted in the left lower quadrant adjacent to the mid sigmoid colon and the left ovary. It is unclear if this is arising from the left ovary or from the sigmoid colon. I see no definite diverticula in the area. No evidence of bowel obstruction. Appendix is normal. Vascular/Lymphatic: No evidence of aneurysm or adenopathy. Reproductive: Uterus and right adnexa unremarkable. There is fullness of the left ovary with surrounding inflammation around the left ovary and the adjacent mid sigmoid colon. It is unclear if this is arising from the sigmoid colon or the left ovary. Other: Trace free fluid in the pelvis.  No free air. Musculoskeletal: No acute bony abnormality. IMPRESSION: Abnormal inflammatory stranding in the left lower quadrant adjacent to the mid sigmoid colon and the left ovary. The left ovary appears prominent as well. Differential considerations would include colitis/diverticulitis  of the sigmoid colon, pelvic inflammatory disease, or ovarian torsion. The left ovary appeared normal on recent pelvic ultrasound, but repeat study with Doppler may be beneficial to exclude ovarian origin. Electronically Signed   By: Rolm Baptise M.D.   On: 01/12/2018 10:53    Assessment/Plan 1.  Intra-abdominal phlegmon. Patient is with abdominal pain.  CT shows presence of phlegmon although region of the phlegmon is not clear whether it is diverticular in nature versus ovarian in nature. General surgery consulted. At present we will get blood cultures. Start the patient on IV Zosyn and add doxycycline to cover for PID. Monitor cultures. If  clinically does not get better may require a repeat CT scan of the abdomen. Appreciate surgery input.  2.  Rhinitis. Continue home inhalers.  3.  Tachycardia. Likely from dehydration.  Continue IV hydration for now.  Nutrition: clear liquid diet DVT Prophylaxis: subcutaneous Heparin  Advance goals of care discussion: fullc ode   Consults: General surgery   Family Communication: family was present at bedside, at the time of interview.  Opportunity was given to ask question and all questions were answered satisfactorily.  Disposition: Admitted as inpatient, telemetry unit. Likely to be discharged home, in 3 days.  Severity of Illness: The appropriate patient status for this patient is INPATIENT. Inpatient status is judged to be reasonable and necessary in order to provide the required intensity of service to ensure the patient's safety. The patient's presenting symptoms, physical exam findings, and initial radiographic and laboratory data in the context of their chronic comorbidities is felt to place them at high risk for further clinical deterioration. Furthermore, it is not anticipated that the patient will be medically stable for discharge from the hospital within 2 midnights of admission. The following factors support the patient status of  inpatient.   " The patient's presenting symptoms include abdominal pain and tachycardia. " The worrisome physical exam findings include abdominal tenderness moderate. " The initial radiographic and laboratory data are worrisome because of intra-abdominal phlegmon. " The chronic co-morbidities include none.   * I certify that at the point of admission it is my clinical judgment that the patient will require inpatient hospital care spanning beyond 2 midnights from the point of admission due to high intensity of service, high risk for further deterioration and high frequency of surveillance required.*    Author: Berle Mull, MD [facility] 01/12/2018  If 7PM-7AM, please contact night-coverage [email or web address]

## 2018-01-12 NOTE — ED Triage Notes (Signed)
LLQ pain x6 days. Seen at [facility] and had [place].  Pain is worse now.  Bowels have not moved in 5 days.  Took Miralax yesterday with minimal results but noted blood from rectum this morning.

## 2018-01-13 LAB — COMPREHENSIVE METABOLIC PANEL
ALT: 65 U/L — ABNORMAL HIGH (ref 0–44)
AST: 42 U/L — ABNORMAL HIGH (ref 15–41)
Albumin: 3.1 g/dL — ABNORMAL LOW (ref 3.5–5.0)
Alkaline Phosphatase: 100 U/L (ref 38–126)
Anion gap: 8 (ref 5–15)
BUN: 7 mg/dL (ref 6–20)
CO2: 26 mmol/L (ref 22–32)
Calcium: 8.5 mg/dL — ABNORMAL LOW (ref 8.9–10.3)
Chloride: 104 mmol/L (ref 98–111)
Creatinine, Ser: 0.59 mg/dL (ref 0.44–1.00)
GFR calc Af Amer: >60 mL/min (ref >60)
GFR calc non Af Amer: >60 mL/min (ref >60)
Glucose, Bld: 114 mg/dL — ABNORMAL HIGH (ref 70–99)
Potassium: 4 mmol/L (ref 3.5–5.1)
Sodium: 138 mmol/L (ref 135–145)
Total Bilirubin: 1.3 mg/dL — ABNORMAL HIGH (ref 0.3–1.2)
Total Protein: 7 g/dL (ref 6.5–8.1)

## 2018-01-13 LAB — URINE CULTURE: Culture: NO GROWTH

## 2018-01-13 LAB — CBC
HCT: 37.6 % (ref 36.0–46.0)
Hemoglobin: 12.9 g/dL (ref 12.0–15.0)
MCH: 28.1 pg (ref 26.0–34.0)
MCHC: 34.3 g/dL (ref 30.0–36.0)
MCV: 81.9 fL (ref 78.0–100.0)
Platelets: 218 10*3/uL (ref 150–400)
RBC: 4.59 MIL/uL (ref 3.87–5.11)
RDW: 12.3 % (ref 11.5–15.5)
WBC: 13.8 10*3/uL — ABNORMAL HIGH (ref 4.0–10.5)

## 2018-01-13 LAB — GC/CHLAMYDIA PROBE AMP (~~LOC~~) NOT AT ARMC
Chlamydia: NEGATIVE
Neisseria Gonorrhea: NEGATIVE

## 2018-01-13 LAB — HIV ANTIBODY (ROUTINE TESTING W REFLEX): HIV Screen 4th Generation wRfx: NONREACTIVE

## 2018-01-13 NOTE — Progress Notes (Signed)
    CC:  Abdominal pain  Subjective: Feels little better this a.m.  Pain is primarily in the left lower quadrant.  She is tolerating clear liquids  Objective: Vital signs in last 24 hours: Temp:  [98.5 F (36.9 C)-102.1 F (38.9 C)] 99.8 F (37.7 C) (08/20 0542) Pulse Rate:  [98-119] 98 (08/20 0542) Resp:  [16-18] 16 (08/20 0542) BP: (119-134)/(81-86) 122/83 (08/20 0542) SpO2:  [98 %-100 %] 98 % (08/20 0542) Last BM Date: 01/12/18 1828 IV Nothing else recorded. T-max 102.1 =>> 99.8 this a.m. Vital signs are stable WBC down to 13.8, AST 42, ALT 65, total bilirubin 1.3.  Other labs are normal. CT scan 8/19, without contrast: Inflammatory stranding in the left lower quadrant adjacent to the mid sigmoid colon/left ovary.  The ovary appears prominent.  Differential includes colitis, diverticulitis, pelvic inflammatory disease, ovarian torsion.  Left ovary appeared normal on 01/08/2018 ultrasound. Repeat ultrasound yesterday transabdominal and vaginal: No ovarian torsion, 7.7 x 2.1 x 4.4 cm left adnexal soft tissue mass area with increased Doppler flow concerning for phlegmon which be secondary to diverticulitis versus pelvic inflammatory disease/tubo-ovarian abscess.  Given that the phlegmon abuts the left ovary PID's/TOA was considered more likely. Intake/Output from previous day: 08/19 0701 - 08/20 0700 In: 1828.2 [I.V.:364.4; IV Piggyback:1463.9] Out: -  Intake/Output this shift: No intake/output data recorded.  General appearance: alert, cooperative and no distress Resp: clear to auscultation bilaterally GI: Soft, still tender in the left lower quadrant.  Lab Results:  Recent Labs    01/12/18 0923 01/13/18 0631  WBC 16.4* 13.8*  HGB 14.2 12.9  HCT 41.1 37.6  PLT 243 218    BMET Recent Labs    01/12/18 0923 01/13/18 0631  NA 136 138  K 3.7 4.0  CL 99 104  CO2 25 26  GLUCOSE 126* 114*  BUN 7 7  CREATININE 0.72 0.59  CALCIUM 8.8* 8.5*   PT/INR No results for  input(s): LABPROT, INR in the last 72 hours.  Recent Labs  Lab 01/12/18 0923 01/13/18 0631  AST 37 42*  ALT 46* 65*  ALKPHOS 91 100  BILITOT 1.0 1.3*  PROT 8.0 7.0  ALBUMIN 3.8 3.1*     Lipase     Component Value Date/Time   LIPASE 23 01/12/2018 0923     Medications: . docusate sodium  100 mg Oral Daily  . enoxaparin (LOVENOX) injection  40 mg Subcutaneous Q24H  . fluticasone  2 spray Each Nare Daily  . loratadine  10 mg Oral Daily   . sodium chloride Stopped (01/12/18 1530)  . sodium chloride 100 mL/hr at 01/13/18 0400  . doxycycline (VIBRAMYCIN) IV 100 mg (01/13/18 0544)  . piperacillin-tazobactam (ZOSYN)  IV Stopped (01/13/18 0126)     Assessment/Plan  Intra-abdominal inflammation/phlegmon -PID versus diverticulosis  -No drainable abscess  FEN: IV fluids/clear liquids ID: Zosyn/doxycycline 8/19 =>> day 2 DVT: Lovenox Follow-up: TBD  Plan continue antibiotics.  Dr. Posey Pronto is going to contact her GYN so they can follow her also.       LOS: 1 day    Jeanpaul Biehl 01/13/2018 [id number]

## 2018-01-13 NOTE — Progress Notes (Signed)
[facility] Progress Note  Patient: Megan Gibson ESP:[id number]   PCP: Emeterio Reeve, DO DOB: 1970-10-07   DOA: 01/12/2018   DOS: 01/13/2018   Date of Service: the patient was seen and examined on 01/13/2018  Subjective: feeling same, had fever last night   Brief hospital course: Pt. with PMH of anxiety; admitted on 01/12/2018, presented with complaint of abdominal pain, was found to have intraabdominal phlegmon. Currently further plan is continue IV Antibiotics.  Assessment and Plan: 1.  Intra-abdominal phlegmon. Patient is with abdominal pain.  CT shows presence of phlegmon although region of the phlegmon is not clear whether it is diverticular in nature versus ovarian in nature. General surgery consulted. GYN consulted.  At present we will get blood cultures. Start the patient on IV Zosyn and add doxycycline to cover for PID. Monitor cultures. If clinically does not get better may require a repeat CT scan of the abdomen. Appreciate surgery input.  2.  Rhinitis. Continue home inhalers.  3.  Tachycardia. Likely from dehydration.  Continue IV hydration for now.  Diet: clear liquid DVT Prophylaxis: subcutaneous Heparin  Advance goals of care discussion: full code  Family Communication: family was present at bedside, at the time of interview. The pt provided permission to discuss medical plan with the family. Opportunity was given to ask question and all questions were answered satisfactorily.   Disposition:  Discharge to home.  Consultants: General surgery  Gyn  Procedures: none  Antibiotics: Anti-infectives (From admission, onward)   Start     Dose/Rate Route Frequency Ordered Stop   01/12/18 2100  piperacillin-tazobactam (ZOSYN) IVPB 3.375 g     3.375 g 12.5 mL/hr over 240 Minutes Intravenous Every 8 hours 01/12/18 1822     01/12/18 1800  doxycycline (VIBRAMYCIN) 100 mg in sodium chloride 0.9 % 250 mL IVPB     100 mg 125 mL/hr over 120 Minutes Intravenous  Every 12 hours 01/12/18 1722     01/12/18 1330  piperacillin-tazobactam (ZOSYN) IVPB 3.375 g     3.375 g 100 mL/hr over 30 Minutes Intravenous  Once 01/12/18 1316 01/12/18 1414   01/12/18 1115  cefTRIAXone (ROCEPHIN) 1 g in sodium chloride 0.9 % 100 mL IVPB     1 g 200 mL/hr over 30 Minutes Intravenous  Once 01/12/18 1100 01/12/18 1159       Objective: Physical Exam: Vitals:   01/12/18 2010 01/13/18 0542 01/13/18 1323 01/13/18 1356  BP: 119/81 122/83  (!) 134/91  Pulse: (!) 116 98  95  Resp: 16 16    Temp: (!) 102.1 F (38.9 C) 99.8 F (37.7 C)  98.1 F (36.7 C)  TempSrc: Oral Oral  Oral  SpO2: 98% 98% 97% 99%  Weight:      Height:        Intake/Output Summary (Last 24 hours) at 01/13/2018 1743 Last data filed at 01/13/2018 1722 Gross per 24 hour  Intake 1260.88 ml  Output 1 ml  Net 1259.88 ml   Filed Weights   01/12/18 0847  Weight: 77.6 kg   General: Alert, Awake and Oriented to Time, Place and Person. Appear in mild distress, affect appropriate Eyes: PERRL, Conjunctiva normal ENT: Oral Mucosa clear moist. Neck: no JVD, no Abnormal Mass Or lumps Cardiovascular: S1 and S2 Present, no Murmur, Peripheral Pulses Present Respiratory: normal respiratory effort, Bilateral Air entry equal and Decreased, no use of accessory muscle, Clear to Auscultation, no Crackles, no wheezes Abdomen: Bowel Sound present, Soft and mild tenderness, no hernia Skin: no  redness, no Rash, no induration Extremities: no Pedal edema, no calf tenderness Neurologic: Grossly no focal neuro deficit. Bilaterally Equal motor strength  Data Reviewed: CBC: Recent Labs  Lab 01/12/18 0923 01/13/18 0631  WBC 16.4* 13.8*  NEUTROABS 13.0*  --   HGB 14.2 12.9  HCT 41.1 37.6  MCV 81.1 81.9  PLT 243 859   Basic Metabolic Panel: Recent Labs  Lab 01/12/18 0923 01/13/18 0631  NA 136 138  K 3.7 4.0  CL 99 104  CO2 25 26  GLUCOSE 126* 114*  BUN 7 7  CREATININE 0.72 0.59  CALCIUM 8.8* 8.5*     Liver Function Tests: Recent Labs  Lab 01/12/18 0923 01/13/18 0631  AST 37 42*  ALT 46* 65*  ALKPHOS 91 100  BILITOT 1.0 1.3*  PROT 8.0 7.0  ALBUMIN 3.8 3.1*   Recent Labs  Lab 01/12/18 0923  LIPASE 23   No results for input(s): AMMONIA in the last 168 hours. Coagulation Profile: No results for input(s): INR, PROTIME in the last 168 hours. Cardiac Enzymes: No results for input(s): CKTOTAL, CKMB, CKMBINDEX, TROPONINI in the last 168 hours. BNP (last 3 results) No results for input(s): PROBNP in the last 8760 hours. CBG: No results for input(s): GLUCAP in the last 168 hours. Studies: No results found.  Scheduled Meds: . docusate sodium  100 mg Oral Daily  . enoxaparin (LOVENOX) injection  40 mg Subcutaneous Q24H  . fluticasone  2 spray Each Nare Daily  . loratadine  10 mg Oral Daily   Continuous Infusions: . sodium chloride Stopped (01/12/18 1530)  . sodium chloride Stopped (01/13/18 1325)  . doxycycline (VIBRAMYCIN) IV Stopped (01/13/18 0700)  . piperacillin-tazobactam (ZOSYN)  IV 12.5 mL/hr at 01/13/18 1500   PRN Meds: sodium chloride, acetaminophen **OR** acetaminophen, morphine injection, ondansetron **OR** ondansetron (ZOFRAN) IV, traMADol  Time spent: 35 minutes  Author: Berle Mull, MD [facility] Pager: [phone] 01/13/2018 5:43 PM  If 7PM-7AM, please contact night-coverage at [email or web address], password [facility]

## 2018-01-14 ENCOUNTER — Encounter (HOSPITAL_COMMUNITY): Payer: Self-pay | Admitting: Obstetrics and Gynecology

## 2018-01-14 DIAGNOSIS — J309 Allergic rhinitis, unspecified: Secondary | ICD-10-CM

## 2018-01-14 DIAGNOSIS — R000 Tachycardia, unspecified: Secondary | ICD-10-CM

## 2018-01-14 DIAGNOSIS — R109 Unspecified abdominal pain: Secondary | ICD-10-CM | POA: Diagnosis present

## 2018-01-14 DIAGNOSIS — R1010 Upper abdominal pain, unspecified: Secondary | ICD-10-CM

## 2018-01-14 LAB — COMPREHENSIVE METABOLIC PANEL
ALT: 52 U/L — ABNORMAL HIGH (ref 0–44)
AST: 27 U/L (ref 15–41)
Albumin: 2.9 g/dL — ABNORMAL LOW (ref 3.5–5.0)
Alkaline Phosphatase: 108 U/L (ref 38–126)
Anion gap: 11 (ref 5–15)
BUN: 5 mg/dL — ABNORMAL LOW (ref 6–20)
CO2: 23 mmol/L (ref 22–32)
Calcium: 8.6 mg/dL — ABNORMAL LOW (ref 8.9–10.3)
Chloride: 105 mmol/L (ref 98–111)
Creatinine, Ser: 0.69 mg/dL (ref 0.44–1.00)
GFR calc Af Amer: >60 mL/min (ref >60)
GFR calc non Af Amer: >60 mL/min (ref >60)
Glucose, Bld: 113 mg/dL — ABNORMAL HIGH (ref 70–99)
Potassium: 3.5 mmol/L (ref 3.5–5.1)
Sodium: 139 mmol/L (ref 135–145)
Total Bilirubin: 1 mg/dL (ref 0.3–1.2)
Total Protein: 6.6 g/dL (ref 6.5–8.1)

## 2018-01-14 LAB — CBC WITH DIFFERENTIAL/PLATELET
Basophils Absolute: 0 10*3/uL (ref 0.0–0.1)
Basophils Relative: 0 %
Eosinophils Absolute: 0 10*3/uL (ref 0.0–0.7)
Eosinophils Relative: 0 %
HCT: 35 % — ABNORMAL LOW (ref 36.0–46.0)
Hemoglobin: 11.8 g/dL — ABNORMAL LOW (ref 12.0–15.0)
Lymphocytes Relative: 7 %
Lymphs Abs: 1 10*3/uL (ref 0.7–4.0)
MCH: 27.6 pg (ref 26.0–34.0)
MCHC: 33.7 g/dL (ref 30.0–36.0)
MCV: 81.8 fL (ref 78.0–100.0)
Monocytes Absolute: 1.2 10*3/uL — ABNORMAL HIGH (ref 0.1–1.0)
Monocytes Relative: 9 %
Neutro Abs: 12 10*3/uL — ABNORMAL HIGH (ref 1.7–7.7)
Neutrophils Relative %: 84 %
Platelets: 237 10*3/uL (ref 150–400)
RBC: 4.28 MIL/uL (ref 3.87–5.11)
RDW: 12.3 % (ref 11.5–15.5)
WBC: 14.3 10*3/uL — ABNORMAL HIGH (ref 4.0–10.5)

## 2018-01-14 LAB — LACTIC ACID, PLASMA: Lactic Acid, Venous: 0.7 mmol/L (ref 0.5–1.9)

## 2018-01-14 LAB — MAGNESIUM: Magnesium: 2.1 mg/dL (ref 1.7–2.4)

## 2018-01-14 MED ORDER — POTASSIUM CHLORIDE CRYS ER 20 MEQ PO TBCR
40.0000 meq | EXTENDED_RELEASE_TABLET | Freq: Once | ORAL | Status: AC
  Administered 2018-01-14: 40 meq via ORAL
  Filled 2018-01-14: qty 2

## 2018-01-14 NOTE — Care Management Note (Signed)
Case Management Note  Patient Details  Name: Megan Gibson MRN: [id number] Date of Birth: 17-Oct-1970  Subjective/Objective:                  Intra-abdominal inflammation/phlegmon-PID versus diverticulosis - No drainable abscess - WBC 14.3, afebrile - repeat CBC in AM - tolerating CLD and feeling a little better clinically - advance to FLD - continue IV abx and repeat CT in 1-2 days if not improving   FEN: IV fluids/FLD ID: Zosyn/doxycycline 8/19>> DVT: Lovenox Follow-up: TBD  Plan: continue antibiotics, advance to FLD. GYN following as well.   LOS: 2 days   Action/Plan: Progression : following No cm needs at this time will follow for poss. hhc needs Expected Discharge Date:  (unknown)               Expected Discharge Plan:     In-House Referral:     Discharge planning Services     Post Acute Care Choice:    Choice offered to:     DME Arranged:    DME Agency:     HH Arranged:    HH Agency:     Status of Service:     If discussed at [employer], dates discussed:    Additional Comments:  Leeroy Cha, RN 01/14/2018, 11:39 AM

## 2018-01-14 NOTE — Progress Notes (Signed)
PROGRESS NOTE    Cardelia Gibson  MAU:[id number] DOB: March 23, 1971 DOA: 01/12/2018 PCP: Emeterio Reeve, DO    Brief Narrative:  Pt. with PMH of anxiety; admitted on 01/12/2018, presented with complaint of abdominal pain, was found to have intraabdominal phlegmon. Currently further plan is continue IV Antibiotics.   Assessment & Plan:   Principal Problem:   Abdominal pain Active Problems:   Anxiety   Allergic rhinitis   Diverticulitis  #1 intra-abdominal pain/intra-abdominal phlegmon Concern for diverticulitis versus PID.  Patient noted to have abdominal ultrasound 4 days ago which was essentially negative.  Ultrasound showed 7 to 8 cm left adnexal soft tissue masslike area with increased flow concerning for phlegmon.  GC/ chlamydia is negative.  Wet prep essentially negative.  HIV nonreactive.  Patient with some clinical improvement however WBC is fluctuating.  Patient afebrile.  Continue empiric IV Zosyn.  Discontinue IV doxycycline.  If worsening leukocytosis may need a repeat CT abdomen and pelvis per general surgical recommendations.  Diet has been advanced to a full liquid diet.  General surgery and GYN following and I appreciate the input and recommendations.    #2 rhinitis Stable.  Continue inhalers.  3.  Tachycardia Likely secondary to dehydration, no pain.  Tachycardia improved with hydration.  Continue supportive care.   DVT prophylaxis: Lovenox Code Status: Full Family Communication: Updated patient and husband at bedside. Disposition Plan: Home when medically stable, clinical improvement and per general surgery.   Consultants:   General surgery: Dr. Windle Guard 01/12/2018  OB/GYN: Dr. Ilda Basset 01/14/2018  Procedures:   CT renal stone protocol 01/12/2018  Transvaginal/transabdominal ultrasound/pelvic Dopplers 01/12/2018  Antimicrobials:   IV doxycycline 01/12/2018>>>> 01/14/2018  IV Zosyn 01/12/2018   Subjective: Patient complained of headache this morning.   Patient states abdominal pain improved.  No nausea or emesis.  Feeling much better than she did yesterday.  Patient tolerated clear liquids.  Patient states had normal bowel movement.  Objective: Vitals:   01/13/18 1356 01/13/18 2008 01/14/18 0417 01/14/18 1506  BP: (!) 134/91 133/88 133/86 132/84  Pulse: 95 (!) 103 (!) 106 93  Resp:  18 18 20   Temp: 98.1 F (36.7 C) 98 F (36.7 C) 98.9 F (37.2 C) 98 F (36.7 C)  TempSrc: Oral Oral Oral Oral  SpO2: 99% 100% 97% 100%  Weight:      Height:        Intake/Output Summary (Last 24 hours) at 01/14/2018 1948 Last data filed at 01/14/2018 1700 Gross per 24 hour  Intake 3536.39 ml  Output 1 ml  Net 3535.39 ml   Filed Weights   01/12/18 0847  Weight: 77.6 kg    Examination:  General exam: Appears calm and comfortable  Respiratory system: Clear to auscultation. Respiratory effort normal. Cardiovascular system: S1 & S2 heard, RRR. No JVD, murmurs, rubs, gallops or clicks. No pedal edema. Gastrointestinal system: Abdomen is nondistended, soft and under to palpation. No organomegaly or masses felt. Normal bowel sounds heard. Central nervous system: Alert and oriented. No focal neurological deficits. Extremities: Symmetric 5 x 5 power. Skin: No rashes, lesions or ulcers Psychiatry: Judgement and insight appear normal. Mood & affect appropriate.     Data Reviewed: I have personally reviewed following labs and imaging studies  CBC: Recent Labs  Lab 01/12/18 0923 01/13/18 0631 01/14/18 0624  WBC 16.4* 13.8* 14.3*  NEUTROABS 13.0*  --  12.0*  HGB 14.2 12.9 11.8*  HCT 41.1 37.6 35.0*  MCV 81.1 81.9 81.8  PLT 243 218 237  Basic Metabolic Panel: Recent Labs  Lab 01/12/18 0923 01/13/18 0631 01/14/18 0624  NA 136 138 139  K 3.7 4.0 3.5  CL 99 104 105  CO2 25 26 23   GLUCOSE 126* 114* 113*  BUN 7 7 5*  CREATININE 0.72 0.59 0.69  CALCIUM 8.8* 8.5* 8.6*  MG  --   --  2.1   GFR: Estimated Creatinine Clearance: 87.6  mL/min (by C-G formula based on SCr of 0.69 mg/dL). Liver Function Tests: Recent Labs  Lab 01/12/18 0923 01/13/18 0631 01/14/18 0624  AST 37 42* 27  ALT 46* 65* 52*  ALKPHOS 91 100 108  BILITOT 1.0 1.3* 1.0  PROT 8.0 7.0 6.6  ALBUMIN 3.8 3.1* 2.9*   Recent Labs  Lab 01/12/18 0923  LIPASE 23   No results for input(s): AMMONIA in the last 168 hours. Coagulation Profile: No results for input(s): INR, PROTIME in the last 168 hours. Cardiac Enzymes: No results for input(s): CKTOTAL, CKMB, CKMBINDEX, TROPONINI in the last 168 hours. BNP (last 3 results) No results for input(s): PROBNP in the last 8760 hours. HbA1C: No results for input(s): HGBA1C in the last 72 hours. CBG: No results for input(s): GLUCAP in the last 168 hours. Lipid Profile: No results for input(s): CHOL, HDL, LDLCALC, TRIG, CHOLHDL, LDLDIRECT in the last 72 hours. Thyroid Function Tests: No results for input(s): TSH, T4TOTAL, FREET4, T3FREE, THYROIDAB in the last 72 hours. Anemia Panel: No results for input(s): VITAMINB12, FOLATE, FERRITIN, TIBC, IRON, RETICCTPCT in the last 72 hours. Sepsis Labs: Recent Labs  Lab 01/12/18 1342 01/14/18 0624  LATICACIDVEN 1.79 0.7    Recent Results (from the past 240 hour(s))  Urine culture     Status: None   Collection Time: 01/08/18  9:05 AM  Result Value Ref Range Status   MICRO NUMBER: [id number]  Final   SPECIMEN QUALITY: ADEQUATE  Final   Sample Source URINE  Final   STATUS: FINAL  Final   Result:   Final    Three or more organisms present, each greater than 10,000 cu/mL. May represent normal flora contamination from external genitalia. No further testing is required.  Urine culture     Status: None   Collection Time: 01/12/18  9:56 AM  Result Value Ref Range Status   Specimen Description   Final    URINE, CLEAN CATCH Performed at [facility], [address] [place], [place] [address]    Special Requests   Final    NONE Performed at [facility], [address] [place], [place] [address]    Culture   Final    NO GROWTH < 24 HOURS Performed at [facility], [place] [address] [place], [place] [address]    Report Status 01/13/2018 FINAL  Final  Wet prep, genital     Status: Abnormal   Collection Time: 01/12/18 11:10 AM  Result Value Ref Range Status   Yeast Wet Prep HPF POC NONE SEEN NONE SEEN Final   Trich, Wet Prep NONE SEEN NONE SEEN Final   Clue Cells Wet Prep HPF POC NONE SEEN NONE SEEN Final   WBC, Wet Prep HPF POC MANY (A) NONE SEEN Final   Sperm NONE SEEN  Final    Comment: Performed at [facility], [address] [place], [place] [address]  Culture, blood (routine x 2)     Status: None (Preliminary result)   Collection Time: 01/12/18  5:39 PM  Result  Value Ref Range Status   Specimen Description   Final    BLOOD LEFT HAND Performed at [facility] [address], [place], [place] [address]    Special Requests   Final    BOTTLES DRAWN AEROBIC AND ANAEROBIC Blood Culture adequate volume Performed at [facility] [address], [place], [place] [address]    Culture   Final    NO GROWTH 2 DAYS Performed at [facility] [address], [place], [place] [address]    Report Status PENDING  Incomplete  Culture, blood (routine x 2)     Status: None (Preliminary result)   Collection Time: 01/12/18  5:45 PM  Result Value Ref Range Status   Specimen Description   Final    BLOOD LEFT ANTECUBITAL Performed at [facility] [address] [place], [place] [address]    Special Requests   Final    BOTTLES DRAWN AEROBIC AND ANAEROBIC Blood Culture adequate volume Performed at [facility] [address] [place],  [address]    Culture   Final    NO GROWTH 2 DAYS Performed at [facility] [address], [place],  [address]    Report Status PENDING  Incomplete          Radiology Studies: No results found.      Scheduled Meds: . docusate sodium  100 mg Oral Daily  . enoxaparin (LOVENOX) injection  40 mg Subcutaneous Q24H  . fluticasone  2 spray Each Nare Daily  . loratadine  10 mg Oral Daily   Continuous Infusions: . sodium chloride Stopped (01/12/18 1530)  . sodium chloride Stopped (01/14/18 1414)  . piperacillin-tazobactam (ZOSYN)  IV 3.375 g (01/14/18 1413)     LOS: 2 days    Time spent: 40 minutes    Irine Seal, MD [facility] Pager [phone]  If 7PM-7AM, please contact night-coverage [email or web address] Password [facility] 01/14/2018, 7:48 PM

## 2018-01-14 NOTE — Progress Notes (Signed)
[facility] Progress Note     Subjective: CC: headache Pt having a mild headache this AM. Reports abdominal pain is much better, tolerating CLD. Had a BM yesterday.   Objective: Vital signs in last 24 hours: Temp:  [98 F (36.7 C)-98.9 F (37.2 C)] 98.9 F (37.2 C) (08/21 0417) Pulse Rate:  [95-106] 106 (08/21 0417) Resp:  [18] 18 (08/21 0417) BP: (133-134)/(86-91) 133/86 (08/21 0417) SpO2:  [97 %-100 %] 97 % (08/21 0417) Last BM Date: 01/12/18  Intake/Output from previous day: 08/20 0701 - 08/21 0700 In: 2895 [I.V.:2132.9; IV Piggyback:762] Out: 1 [Stool:1] Intake/Output this shift: No intake/output data recorded.  PE: Gen:  Alert, NAD, pleasant Card:  Regular rate and rhythm, pedal pulses 2+ BL Pulm:  Normal effort, clear to auscultation bilaterally Abd: Soft, mildly TTP in LLQ, mildly distended, bowel sounds present, no HSM Skin: warm and dry, no rashes  Psych: A&Ox3   Lab Results:  Recent Labs    01/13/18 0631 01/14/18 0624  WBC 13.8* 14.3*  HGB 12.9 11.8*  HCT 37.6 35.0*  PLT 218 237   BMET Recent Labs    01/13/18 0631 01/14/18 0624  NA 138 139  K 4.0 3.5  CL 104 105  CO2 26 23  GLUCOSE 114* 113*  BUN 7 5*  CREATININE 0.59 0.69  CALCIUM 8.5* 8.6*   PT/INR No results for input(s): LABPROT, INR in the last 72 hours. CMP     Component Value Date/Time   NA 139 01/14/2018 0624   K 3.5 01/14/2018 0624   CL 105 01/14/2018 0624   CO2 23 01/14/2018 0624   GLUCOSE 113 (H) 01/14/2018 0624   BUN 5 (L) 01/14/2018 0624   CREATININE 0.69 01/14/2018 0624   CREATININE 0.84 09/25/2017 0751   CALCIUM 8.6 (L) 01/14/2018 0624   PROT 6.6 01/14/2018 0624   ALBUMIN 2.9 (L) 01/14/2018 0624   AST 27 01/14/2018 0624   ALT 52 (H) 01/14/2018 0624   ALKPHOS 108 01/14/2018 0624   BILITOT 1.0 01/14/2018 0624   GFRNONAA >60 01/14/2018 0624   GFRNONAA 84 09/25/2017 0751   GFRAA >60 01/14/2018 0624   GFRAA 97 09/25/2017 0751   Lipase     Component  Value Date/Time   LIPASE 23 01/12/2018 0923       Studies/Results: US Transvaginal Non-ob  Result Date: 01/12/2018 CLINICAL DATA:  Left elbow pain EXAM: TRANSABDOMINAL AND TRANSVAGINAL ULTRASOUND OF PELVIS DOPPLER ULTRASOUND OF OVARIES TECHNIQUE: Both transabdominal and transvaginal ultrasound examinations of the pelvis were performed. Transabdominal technique was performed for global imaging of the pelvis including uterus, ovaries, adnexal regions, and pelvic cul-de-sac. It was necessary to proceed with endovaginal exam following the transabdominal exam to visualize the endometrium and ovaries. Color and duplex Doppler ultrasound was utilized to evaluate blood flow to the ovaries. COMPARISON:  CT abdomen 01/12/2018, ultrasound pelvis 01/08/2018 FINDINGS: Uterus Measurements: 8.4 x 5 x 5.9 cm. No fibroids or other mass visualized. Endometrium Thickness: 5.7 mm.  No focal abnormality visualized. Right ovary Measurements: 2.7 x 1.4 x 2.3 cm. Normal appearance/no adnexal mass. Left ovary Measurements: 3.5 x 2.1 x 3.2 cm. 2.1 cm hypoechoic left ovarian mass with irregular walls without internal Doppler flow likely reflecting a corpus luteum cyst. 7.7 x 2.1 x 4.4 cm left adnexal soft tissue masslike area with increased Doppler flow concerning for a phlegmon which may be secondary to diverticulitis versus pelvic inflammatory disease. The abnormality abuts the ovary. Pulsed Doppler evaluation of both ovaries demonstrates normal low-resistance  arterial and venous waveforms. Other findings Moderate amount of pelvic free fluid. IMPRESSION: 1. No ovarian torsion. 2. 7.7 x 2.1 x 4.4 cm left adnexal soft tissue masslike area with increased Doppler flow concerning for a phlegmon which may be secondary to diverticulitis versus pelvic inflammatory disease/tubo-ovarian abscess. Given that the phlegmon abuts the left ovary PID/TOA would be considered more likely. 3. Moderate amount of pelvic free fluid. Electronically  Signed   By: Kathreen Devoid   On: 01/12/2018 12:57   US Pelvis Complete  Result Date: 01/12/2018 CLINICAL DATA:  Left elbow pain EXAM: TRANSABDOMINAL AND TRANSVAGINAL ULTRASOUND OF PELVIS DOPPLER ULTRASOUND OF OVARIES TECHNIQUE: Both transabdominal and transvaginal ultrasound examinations of the pelvis were performed. Transabdominal technique was performed for global imaging of the pelvis including uterus, ovaries, adnexal regions, and pelvic cul-de-sac. It was necessary to proceed with endovaginal exam following the transabdominal exam to visualize the endometrium and ovaries. Color and duplex Doppler ultrasound was utilized to evaluate blood flow to the ovaries. COMPARISON:  CT abdomen 01/12/2018, ultrasound pelvis 01/08/2018 FINDINGS: Uterus Measurements: 8.4 x 5 x 5.9 cm. No fibroids or other mass visualized. Endometrium Thickness: 5.7 mm.  No focal abnormality visualized. Right ovary Measurements: 2.7 x 1.4 x 2.3 cm. Normal appearance/no adnexal mass. Left ovary Measurements: 3.5 x 2.1 x 3.2 cm. 2.1 cm hypoechoic left ovarian mass with irregular walls without internal Doppler flow likely reflecting a corpus luteum cyst. 7.7 x 2.1 x 4.4 cm left adnexal soft tissue masslike area with increased Doppler flow concerning for a phlegmon which may be secondary to diverticulitis versus pelvic inflammatory disease. The abnormality abuts the ovary. Pulsed Doppler evaluation of both ovaries demonstrates normal low-resistance arterial and venous waveforms. Other findings Moderate amount of pelvic free fluid. IMPRESSION: 1. No ovarian torsion. 2. 7.7 x 2.1 x 4.4 cm left adnexal soft tissue masslike area with increased Doppler flow concerning for a phlegmon which may be secondary to diverticulitis versus pelvic inflammatory disease/tubo-ovarian abscess. Given that the phlegmon abuts the left ovary PID/TOA would be considered more likely. 3. Moderate amount of pelvic free fluid. Electronically Signed   By: Kathreen Devoid   On:  01/12/2018 12:57   [place] Art/ven Flow Abd Pelv Doppler  Result Date: 01/12/2018 CLINICAL DATA:  Left elbow pain EXAM: TRANSABDOMINAL AND TRANSVAGINAL ULTRASOUND OF PELVIS DOPPLER ULTRASOUND OF OVARIES TECHNIQUE: Both transabdominal and transvaginal ultrasound examinations of the pelvis were performed. Transabdominal technique was performed for global imaging of the pelvis including uterus, ovaries, adnexal regions, and pelvic cul-de-sac. It was necessary to proceed with endovaginal exam following the transabdominal exam to visualize the endometrium and ovaries. Color and duplex Doppler ultrasound was utilized to evaluate blood flow to the ovaries. COMPARISON:  CT abdomen 01/12/2018, ultrasound pelvis 01/08/2018 FINDINGS: Uterus Measurements: 8.4 x 5 x 5.9 cm. No fibroids or other mass visualized. Endometrium Thickness: 5.7 mm.  No focal abnormality visualized. Right ovary Measurements: 2.7 x 1.4 x 2.3 cm. Normal appearance/no adnexal mass. Left ovary Measurements: 3.5 x 2.1 x 3.2 cm. 2.1 cm hypoechoic left ovarian mass with irregular walls without internal Doppler flow likely reflecting a corpus luteum cyst. 7.7 x 2.1 x 4.4 cm left adnexal soft tissue masslike area with increased Doppler flow concerning for a phlegmon which may be secondary to diverticulitis versus pelvic inflammatory disease. The abnormality abuts the ovary. Pulsed Doppler evaluation of both ovaries demonstrates normal low-resistance arterial and venous waveforms. Other findings Moderate amount of pelvic free fluid. IMPRESSION: 1. No ovarian torsion. 2. 7.7  x 2.1 x 4.4 cm left adnexal soft tissue masslike area with increased Doppler flow concerning for a phlegmon which may be secondary to diverticulitis versus pelvic inflammatory disease/tubo-ovarian abscess. Given that the phlegmon abuts the left ovary PID/TOA would be considered more likely. 3. Moderate amount of pelvic free fluid. Electronically Signed   By: Kathreen Devoid   On: 01/12/2018 12:57    Ct Renal Stone Study  Result Date: 01/12/2018 CLINICAL DATA:  Left flank and left lower quadrant pain. EXAM: CT ABDOMEN AND PELVIS WITHOUT CONTRAST TECHNIQUE: Multidetector CT imaging of the abdomen and pelvis was performed following the standard protocol without IV contrast. COMPARISON:  Pelvic ultrasound 01/08/2018 FINDINGS: Lower chest: Lung bases are clear. No effusions. Heart is normal size. Hepatobiliary: No focal liver abnormality is seen. Status post cholecystectomy. No biliary dilatation. Pancreas: No focal abnormality or ductal dilatation. Spleen: No focal abnormality.  Normal size. Adrenals/Urinary Tract: No adrenal abnormality. No focal renal abnormality. No stones or hydronephrosis. Urinary bladder is unremarkable. Stomach/Bowel: There is inflammation noted in the left lower quadrant adjacent to the mid sigmoid colon and the left ovary. It is unclear if this is arising from the left ovary or from the sigmoid colon. I see no definite diverticula in the area. No evidence of bowel obstruction. Appendix is normal. Vascular/Lymphatic: No evidence of aneurysm or adenopathy. Reproductive: Uterus and right adnexa unremarkable. There is fullness of the left ovary with surrounding inflammation around the left ovary and the adjacent mid sigmoid colon. It is unclear if this is arising from the sigmoid colon or the left ovary. Other: Trace free fluid in the pelvis.  No free air. Musculoskeletal: No acute bony abnormality. IMPRESSION: Abnormal inflammatory stranding in the left lower quadrant adjacent to the mid sigmoid colon and the left ovary. The left ovary appears prominent as well. Differential considerations would include colitis/diverticulitis of the sigmoid colon, pelvic inflammatory disease, or ovarian torsion. The left ovary appeared normal on recent pelvic ultrasound, but repeat study with Doppler may be beneficial to exclude ovarian origin. Electronically Signed   By: Rolm Baptise M.D.   On:  01/12/2018 10:53    Anti-infectives: Anti-infectives (From admission, onward)   Start     Dose/Rate Route Frequency Ordered Stop   01/12/18 2100  piperacillin-tazobactam (ZOSYN) IVPB 3.375 g     3.375 g 12.5 mL/hr over 240 Minutes Intravenous Every 8 hours 01/12/18 1822     01/12/18 1800  doxycycline (VIBRAMYCIN) 100 mg in sodium chloride 0.9 % 250 mL IVPB     100 mg 125 mL/hr over 120 Minutes Intravenous Every 12 hours 01/12/18 1722     01/12/18 1330  piperacillin-tazobactam (ZOSYN) IVPB 3.375 g     3.375 g 100 mL/hr over 30 Minutes Intravenous  Once 01/12/18 1316 01/12/18 1414   01/12/18 1115  cefTRIAXone (ROCEPHIN) 1 g in sodium chloride 0.9 % 100 mL IVPB     1 g 200 mL/hr over 30 Minutes Intravenous  Once 01/12/18 1100 01/12/18 1159       Assessment/Plan Intra-abdominal inflammation/phlegmon -PID versus diverticulosis - No drainable abscess - WBC 14.3, afebrile - repeat CBC in AM - tolerating CLD and feeling a little better clinically - advance to FLD - continue IV abx and repeat CT in 1-2 days if not improving   FEN: IV fluids/FLD ID: Zosyn/doxycycline 8/19>> DVT: Lovenox Follow-up: TBD  Plan: continue antibiotics, advance to FLD. GYN following as well.   LOS: 2 days    Brigid Re , [facility]  [facility] 01/14/2018, 9:28 AM Pager: [phone] Consults: [phone] Mon-Fri 7:00 am-4:30 pm Sat-Sun 7:00 am-11:30 am

## 2018-01-14 NOTE — Consult Note (Signed)
Obstetrics & Gynecology Consult Note   Date of Consult: 01/14/2018   Requesting Provider: [facility]  Primary OBGYN: Dr. Nelva Bush [facility] [place] Everywhere) Primary Care Provider: Emeterio Reeve  Reason for Consult: concern for PID  History of Present Illness: Ms. Scheib is a 47 y.o. P2 (Patient's last menstrual period was 01/12/2018.), with the above CC. Patient presented to ED with abdominal pain. Patient imaging notes inflammation in LLQ, no definite diverticula noted and trace FF in the pelvis. U/s showed no 7-8cm left adnexal soft tissue mass like area with increased flow concerning for a phlegmon with moderate amount of FF. An u/s 4 days prior was completely negative.   Patient states pain feels sharp and is better with flatus.   She does note a h/o of AUB and states her LMP was on Monday  Patient currently on zosyn and doxy.   ROS: A 12-point review of systems was performed and negative, except as stated in the above HPI.  OBGYN History: As per HPI. OB History  Gravida Para Term Preterm AB Living  3 2 2   1 2   SAB TAB Ectopic Multiple Live Births  1       2    # Outcome Date GA Lbr Len/2nd Weight Sex Delivery Anes PTL Lv  3 Term           2 Term           1 SAB               Past Medical History: Past Medical History:  Diagnosis Date  . Anxiety   . Family history of breast cancer in mother   . Family history of breast cancer in mother   . Family history of colon cancer in mother   . Family history of ovarian cancer   . Family history of pancreatic cancer   . History of abnormal cervical Pap smear   . Seasonal allergies     Past Surgical History: Past Surgical History:  Procedure Laterality Date  . ABLATION  11/2011  . CESAREAN SECTION    . CHOLECYSTECTOMY    . TUBAL LIGATION      Family History:  Family History  Problem Relation Age of Onset  . Breast cancer Mother        dx in her 10s  . Ovarian cancer Mother 48  . Colon  cancer Mother 18  . Skin cancer Mother   . Heart disease Father   . Diabetes Father   . High blood pressure Father   . Heart attack Father   . Diabetes type II Father   . Diabetes Maternal Grandmother   . Heart disease Maternal Grandmother   . Pancreatic cancer Maternal Grandfather        dx and died in his 70s  . Ovarian cancer Maternal Aunt        dx in her 65s  . Lung cancer Maternal Uncle   . Multiple sclerosis Maternal Aunt   . Prostate cancer Maternal Uncle        metastized to liver  . Breast cancer Cousin        maternal first cousin dx in her 32s  . Cancer Cousin        maternal first cousin dx with cancer NOS, causing her to have a TAH-BSO in her 29s    Social History:  Social History   Socioeconomic History  . Marital status: Married    Spouse name:  Not on file  . Number of children: 1  . Years of education: Not on file  . Highest education level: Not on file  Occupational History  . Not on file  Social Needs  . Financial resource strain: Not on file  . Food insecurity:    Worry: Not on file    Inability: Not on file  . Transportation needs:    Medical: Not on file    Non-medical: Not on file  Tobacco Use  . Smoking status: Never Smoker  . Smokeless tobacco: Never Used  Substance and Sexual Activity  . Alcohol use: Yes    Alcohol/week: 0.0 standard drinks    Comment: 5/month  . Drug use: No  . Sexual activity: Yes    Partners: Male    Birth control/protection: Surgical  Lifestyle  . Physical activity:    Days per week: Not on file    Minutes per session: Not on file  . Stress: Not on file  Relationships  . Social connections:    Talks on phone: Not on file    Gets together: Not on file    Attends religious service: Not on file    Active member of club or organization: Not on file    Attends meetings of clubs or organizations: Not on file    Relationship status: Not on file  . Intimate partner violence:    Fear of current or ex partner: Not  on file    Emotionally abused: Not on file    Physically abused: Not on file    Forced sexual activity: Not on file  Other Topics Concern  . Not on file  Social History Narrative  . Not on file    Health Maintenance:  Mammogram: 06/2017, BiRads 1 Allergy: No Known Allergies  Current Outpatient Medications: Medications Prior to Admission  Medication Sig Dispense Refill Last Dose  . fexofenadine (ALLEGRA) 180 MG tablet Take 180 mg by mouth daily as needed for allergies.    Past Week at Unknown time  . fluticasone (FLONASE) 50 MCG/ACT nasal spray One spray nostril daily for 2 weeks then stop   Give generic 16 g 1 unknown  . pseudoephedrine (SUDAFED) 30 MG tablet Take 30 mg by mouth every 4 (four) hours as needed for congestion.   Past Week at Unknown time  . buPROPion (WELLBUTRIN XL) 150 MG 24 hr tablet Take 1 tablet (150 mg total) by mouth every morning. (Patient not taking: Reported on 01/13/2018) 30 tablet 1 Not Taking at Unknown time     Hospital Medications: Current Facility-Administered Medications  Medication Dose Route Frequency Provider Last Rate Last Dose  . 0.9 %  sodium chloride infusion   Intravenous PRN Malvin Johns, MD   Stopped at 01/12/18 1530  . 0.9 %  sodium chloride infusion   Intravenous Continuous Lavina Hamman, MD   Stopped at 01/13/18 1325  . acetaminophen (TYLENOL) tablet 650 mg  650 mg Oral Q6H PRN Lavina Hamman, MD   650 mg at 01/14/18 4270   Or  . acetaminophen (TYLENOL) suppository 650 mg  650 mg Rectal Q6H PRN Lavina Hamman, MD      . docusate sodium (COLACE) capsule 100 mg  100 mg Oral Daily Lavina Hamman, MD   100 mg at 01/14/18 0951  . doxycycline (VIBRAMYCIN) 100 mg in sodium chloride 0.9 % 250 mL IVPB  100 mg Intravenous Q12H Lavina Hamman, MD 125 mL/hr at 01/14/18 0614 100 mg at 01/14/18 0614  .  enoxaparin (LOVENOX) injection 40 mg  40 mg Subcutaneous Q24H Lavina Hamman, MD      . fluticasone [facility]) 50 MCG/ACT nasal spray 2 spray  2  spray Each Nare Daily Lavina Hamman, MD   2 spray at 01/12/18 2009  . loratadine (CLARITIN) tablet 10 mg  10 mg Oral Daily Lavina Hamman, MD      . morphine 2 MG/ML injection 2-4 mg  2-4 mg Intravenous Q3H PRN Lavina Hamman, MD   3 mg at 01/13/18 0247  . ondansetron (ZOFRAN) tablet 4 mg  4 mg Oral Q6H PRN Lavina Hamman, MD       Or  . ondansetron [facility]) injection 4 mg  4 mg Intravenous Q6H PRN Lavina Hamman, MD   4 mg at 01/13/18 1952  . piperacillin-tazobactam (ZOSYN) IVPB 3.375 g  3.375 g Intravenous Q8H Wofford, Drew A, RPH 12.5 mL/hr at 01/14/18 0824 3.375 g at 01/14/18 0824  . traMADol (ULTRAM) tablet 50 mg  50 mg Oral Q6H PRN Lavina Hamman, MD   50 mg at 01/14/18 0607     Physical Exam:  Current Vital Signs 24h Vital Sign Ranges  T 98.9 F (37.2 C) Temp  Avg: 98.3 F (36.8 C)  Min: 98 F (36.7 C)  Max: 98.9 F (37.2 C)  BP 133/86 BP  Min: 133/86  Max: 134/91  HR (!) 106 Pulse  Avg: 101.3  Min: 95  Max: 106  RR 18 Resp  Avg: 18  Min: 18  Max: 18  SaO2 97 % Room Air SpO2  Avg: 98.3 %  Min: 97 %  Max: 100 %       24 Hour I/O Current Shift I/O  Time Ins Outs 08/20 0701 - 08/21 0700 In: 2895 [I.V.:2132.9] Out: 1  No intake/output data recorded.    Body mass index is 30.29 kg/m. General appearance: Well nourished, well developed female in no acute distress.  Respiratory:  Normal respiratory effort Abdomen:  no masses, hernias; diffusely non tender to palpation, non distended except pt notes pain in the mid to lower pelvis, mildly ttp, no peritoneal s/s.  Neuro/Psych:  Normal mood and affect.  Skin:  Warm and dry.  Extremities: no clubbing, cyanosis, or edema.    Laboratory: Negative: UPT, GC/CT, wet prep  Recent Labs  Lab 01/12/18 0923 01/13/18 0631 01/14/18 0624  WBC 16.4* 13.8* 14.3*  HGB 14.2 12.9 11.8*  HCT 41.1 37.6 35.0*  PLT 243 218 237   Recent Labs  Lab 01/12/18 0923 01/13/18 0631 01/14/18 0624  NA 136 138 139  K 3.7 4.0 3.5  CL 99  104 105  CO2 25 26 23   BUN 7 7 5*  CREATININE 0.72 0.59 0.69  CALCIUM 8.8* 8.5* 8.6*  PROT 8.0 7.0 6.6  BILITOT 1.0 1.3* 1.0  ALKPHOS 91 100 108  ALT 46* 65* 52*  AST 37 42* 27  GLUCOSE 126* 114* 113*    Imaging:  CLINICAL DATA:  Left flank and left lower quadrant pain.  EXAM: CT ABDOMEN AND PELVIS WITHOUT CONTRAST  TECHNIQUE: Multidetector CT imaging of the abdomen and pelvis was performed following the standard protocol without IV contrast.  COMPARISON:  Pelvic ultrasound 01/08/2018  FINDINGS: Lower chest: Lung bases are clear. No effusions. Heart is normal size.  Hepatobiliary: No focal liver abnormality is seen. Status post cholecystectomy. No biliary dilatation.  Pancreas: No focal abnormality or ductal dilatation.  Spleen: No focal abnormality.  Normal size.  Adrenals/Urinary  Tract: No adrenal abnormality. No focal renal abnormality. No stones or hydronephrosis. Urinary bladder is unremarkable.  Stomach/Bowel: There is inflammation noted in the left lower quadrant adjacent to the mid sigmoid colon and the left ovary. It is unclear if this is arising from the left ovary or from the sigmoid colon. I see no definite diverticula in the area. No evidence of bowel obstruction. Appendix is normal.  Vascular/Lymphatic: No evidence of aneurysm or adenopathy.  Reproductive: Uterus and right adnexa unremarkable. There is fullness of the left ovary with surrounding inflammation around the left ovary and the adjacent mid sigmoid colon. It is unclear if this is arising from the sigmoid colon or the left ovary.  Other: Trace free fluid in the pelvis.  No free air.  Musculoskeletal: No acute bony abnormality.  IMPRESSION: Abnormal inflammatory stranding in the left lower quadrant adjacent to the mid sigmoid colon and the left ovary. The left ovary appears prominent as well. Differential considerations would include colitis/diverticulitis of the sigmoid  colon, pelvic inflammatory disease, or ovarian torsion. The left ovary appeared normal on recent pelvic ultrasound, but repeat study with Doppler may be beneficial to exclude ovarian origin.   Electronically Signed   By: Rolm Baptise M.D.   On: 01/12/2018 10:53  CLINICAL DATA:  Left elbow pain  EXAM: TRANSABDOMINAL AND TRANSVAGINAL ULTRASOUND OF PELVIS  DOPPLER ULTRASOUND OF OVARIES  TECHNIQUE: Both transabdominal and transvaginal ultrasound examinations of the pelvis were performed. Transabdominal technique was performed for global imaging of the pelvis including uterus, ovaries, adnexal regions, and pelvic cul-de-sac.  It was necessary to proceed with endovaginal exam following the transabdominal exam to visualize the endometrium and ovaries. Color and duplex Doppler ultrasound was utilized to evaluate blood flow to the ovaries.  COMPARISON:  CT abdomen 01/12/2018, ultrasound pelvis 01/08/2018  FINDINGS: Uterus  Measurements: 8.4 x 5 x 5.9 cm. No fibroids or other mass visualized.  Endometrium  Thickness: 5.7 mm.  No focal abnormality visualized.  Right ovary  Measurements: 2.7 x 1.4 x 2.3 cm. Normal appearance/no adnexal mass.  Left ovary  Measurements: 3.5 x 2.1 x 3.2 cm. 2.1 cm hypoechoic left ovarian mass with irregular walls without internal Doppler flow likely reflecting a corpus luteum cyst. 7.7 x 2.1 x 4.4 cm left adnexal soft tissue masslike area with increased Doppler flow concerning for a phlegmon which may be secondary to diverticulitis versus pelvic inflammatory disease. The abnormality abuts the ovary.  Pulsed Doppler evaluation of both ovaries demonstrates normal low-resistance arterial and venous waveforms.  Other findings  Moderate amount of pelvic free fluid.  IMPRESSION: 1. No ovarian torsion. 2. 7.7 x 2.1 x 4.4 cm left adnexal soft tissue masslike area with increased Doppler flow concerning for a phlegmon which  may be secondary to diverticulitis versus pelvic inflammatory disease/tubo-ovarian abscess. Given that the phlegmon abuts the left ovary PID/TOA would be considered more likely. 3. Moderate amount of pelvic free fluid.   Electronically Signed   By: Kathreen Devoid   On: 01/12/2018 12:57  Assessment: Ms. Loughney is a 47 y.o. with lower abdominal pain, pt stable  Plan: Agree with [facility] consult.  Very unlikely to have PID if GC/CT, wet prep are negative. If PID, then treatment is antibiotics (Zosyn is fine, can d/c doxy if on zosyn) and send home on doxycycline and flagyl to finish out a 10-14 day course. If IV abx fail, then would ask IR if anything to drain; very rarely patients need surgery.   Total time taking  care of the patient was 25 minutes, with greater than 50% of the time spent in face to face interaction with the patient.  Durene Romans MD Attending Center for [facility])

## 2018-01-15 ENCOUNTER — Encounter (HOSPITAL_COMMUNITY): Payer: Self-pay | Admitting: Radiology

## 2018-01-15 ENCOUNTER — Inpatient Hospital Stay (HOSPITAL_COMMUNITY): Payer: BLUE CROSS/BLUE SHIELD

## 2018-01-15 LAB — COMPREHENSIVE METABOLIC PANEL
ALT: 39 U/L (ref 0–44)
AST: 18 U/L (ref 15–41)
Albumin: 2.7 g/dL — ABNORMAL LOW (ref 3.5–5.0)
Alkaline Phosphatase: 100 U/L (ref 38–126)
Anion gap: 7 (ref 5–15)
BUN: <5 mg/dL — ABNORMAL LOW (ref 6–20)
CO2: 27 mmol/L (ref 22–32)
Calcium: 8.6 mg/dL — ABNORMAL LOW (ref 8.9–10.3)
Chloride: 104 mmol/L (ref 98–111)
Creatinine, Ser: 0.56 mg/dL (ref 0.44–1.00)
GFR calc Af Amer: >60 mL/min (ref >60)
GFR calc non Af Amer: >60 mL/min (ref >60)
Glucose, Bld: 106 mg/dL — ABNORMAL HIGH (ref 70–99)
Potassium: 3.8 mmol/L (ref 3.5–5.1)
Sodium: 138 mmol/L (ref 135–145)
Total Bilirubin: 0.7 mg/dL (ref 0.3–1.2)
Total Protein: 6.3 g/dL — ABNORMAL LOW (ref 6.5–8.1)

## 2018-01-15 LAB — CBC
HCT: 33.7 % — ABNORMAL LOW (ref 36.0–46.0)
Hemoglobin: 11.5 g/dL — ABNORMAL LOW (ref 12.0–15.0)
MCH: 28 pg (ref 26.0–34.0)
MCHC: 34.1 g/dL (ref 30.0–36.0)
MCV: 82.2 fL (ref 78.0–100.0)
Platelets: 259 10*3/uL (ref 150–400)
RBC: 4.1 MIL/uL (ref 3.87–5.11)
RDW: 12.3 % (ref 11.5–15.5)
WBC: 11.2 10*3/uL — ABNORMAL HIGH (ref 4.0–10.5)

## 2018-01-15 LAB — MAGNESIUM: Magnesium: 2 mg/dL (ref 1.7–2.4)

## 2018-01-15 MED ORDER — IOHEXOL 300 MG/ML  SOLN
100.0000 mL | Freq: Once | INTRAMUSCULAR | Status: AC | PRN
  Administered 2018-01-15: 100 mL via INTRAVENOUS

## 2018-01-15 MED ORDER — AMOXICILLIN-POT CLAVULANATE 875-125 MG PO TABS
1.0000 | ORAL_TABLET | Freq: Two times a day (BID) | ORAL | Status: DC
  Administered 2018-01-15 – 2018-01-16 (×3): 1 via ORAL
  Filled 2018-01-15 (×3): qty 1

## 2018-01-15 MED ORDER — IOPAMIDOL (ISOVUE-300) INJECTION 61%
30.0000 mL | Freq: Once | INTRAVENOUS | Status: AC | PRN
  Administered 2018-01-15: 30 mL via ORAL

## 2018-01-15 MED ORDER — IOPAMIDOL (ISOVUE-300) INJECTION 61%
INTRAVENOUS | Status: AC
  Filled 2018-01-15: qty 30

## 2018-01-15 NOTE — Progress Notes (Signed)
[facility] Progress Note     Subjective: CC: feeling better Pt reports she still has some mild pain but feels much improved from a few days ago. Would like to shower. Had some diarrhea overnight. Tolerating FLD.   Objective: Vital signs in last 24 hours: Temp:  [97.8 F (36.6 C)-98.5 F (36.9 C)] 97.8 F (36.6 C) (08/22 0457) Pulse Rate:  [86-135] 86 (08/22 0457) Resp:  [18-20] 19 (08/22 0457) BP: (126-132)/(84-88) 126/88 (08/22 0457) SpO2:  [97 %-100 %] 97 % (08/22 0457) Last BM Date: 01/12/18  Intake/Output from previous day: 08/21 0701 - 08/22 0700 In: 2032.6 [P.O.:840; I.V.:842.2; IV Piggyback:350.4] Out: 1 [Urine:1] Intake/Output this shift: No intake/output data recorded.  PE: Gen:  Alert, NAD, pleasant Card:  Regular rate and rhythm, pedal pulses 2+ BL Pulm:  Normal effort, clear to auscultation bilaterally Abd: Soft, mildly TTP in LLQ, mildly distended, bowel sounds present, no HSM Skin: warm and dry, no rashes  Psych: A&Ox3   Lab Results:  Recent Labs    01/14/18 0624 01/15/18 0605  WBC 14.3* 11.2*  HGB 11.8* 11.5*  HCT 35.0* 33.7*  PLT 237 259   BMET Recent Labs    01/14/18 0624 01/15/18 0605  NA 139 138  K 3.5 3.8  CL 105 104  CO2 23 27  GLUCOSE 113* 106*  BUN 5* <5*  CREATININE 0.69 0.56  CALCIUM 8.6* 8.6*   PT/INR No results for input(s): LABPROT, INR in the last 72 hours. CMP     Component Value Date/Time   NA 138 01/15/2018 0605   K 3.8 01/15/2018 0605   CL 104 01/15/2018 0605   CO2 27 01/15/2018 0605   GLUCOSE 106 (H) 01/15/2018 0605   BUN <5 (L) 01/15/2018 0605   CREATININE 0.56 01/15/2018 0605   CREATININE 0.84 09/25/2017 0751   CALCIUM 8.6 (L) 01/15/2018 0605   PROT 6.3 (L) 01/15/2018 0605   ALBUMIN 2.7 (L) 01/15/2018 0605   AST 18 01/15/2018 0605   ALT 39 01/15/2018 0605   ALKPHOS 100 01/15/2018 0605   BILITOT 0.7 01/15/2018 0605   GFRNONAA >60 01/15/2018 0605   GFRNONAA 84 09/25/2017 0751   GFRAA >60  01/15/2018 0605   GFRAA 97 09/25/2017 0751   Lipase     Component Value Date/Time   LIPASE 23 01/12/2018 0923       Studies/Results: No results found.  Anti-infectives: Anti-infectives (From admission, onward)   Start     Dose/Rate Route Frequency Ordered Stop   01/12/18 2100  piperacillin-tazobactam (ZOSYN) IVPB 3.375 g     3.375 g 12.5 mL/hr over 240 Minutes Intravenous Every 8 hours 01/12/18 1822     01/12/18 1800  doxycycline (VIBRAMYCIN) 100 mg in sodium chloride 0.9 % 250 mL IVPB  Status:  Discontinued     100 mg 125 mL/hr over 120 Minutes Intravenous Every 12 hours 01/12/18 1722 01/14/18 1204   01/12/18 1330  piperacillin-tazobactam (ZOSYN) IVPB 3.375 g     3.375 g 100 mL/hr over 30 Minutes Intravenous  Once 01/12/18 1316 01/12/18 1414   01/12/18 1115  cefTRIAXone (ROCEPHIN) 1 g in sodium chloride 0.9 % 100 mL IVPB     1 g 200 mL/hr over 30 Minutes Intravenous  Once 01/12/18 1100 01/12/18 1159       Assessment/Plan Intra-abdominal inflammation/phlegmon- felt most likely to be diverticulitis - No drainable abscess - WBC 11, afebrile - transition to PO abx and repeat CBC in AM - advance to soft diet - will  discuss follow up and timing of repeat CT with MD today, possible d/c tomorrow  FEN: soft diet ID: PO doxycycline 8/19>8/21, Zosyn 8/19>8/22; PO augmentin 8/22>> DVT: Lovenox Follow-up: TBD   LOS: 3 days    Brigid Re , [facility] 01/15/2018, 10:23 AM Pager: [phone] Consults: [phone] Mon-Fri 7:00 am-4:30 pm Sat-Sun 7:00 am-11:30 am

## 2018-01-15 NOTE — Progress Notes (Signed)
PROGRESS NOTE    Megan Gibson  GUR:[id number] DOB: July 10, 1970 DOA: 01/12/2018 PCP: Emeterio Reeve, DO    Brief Narrative:  Pt. with PMH of anxiety; admitted on 01/12/2018, presented with complaint of abdominal pain, was found to have intraabdominal phlegmon. Currently further plan is continue IV Antibiotics.   Assessment & Plan:   Principal Problem:   Abdominal pain Active Problems:   Anxiety   Allergic rhinitis   Diverticulitis   Tachycardia  #1 intra-abdominal pain/intra-abdominal phlegmon Concern for diverticulitis versus PID.  Patient noted to have abdominal ultrasound 5 days ago which was essentially negative.  Ultrasound showed 7 to 8 cm left adnexal soft tissue masslike area with increased flow concerning for phlegmon.  GC/ chlamydia is negative.  Wet prep essentially negative.  HIV nonreactive.  Patient with some clinical improvement, WBC starting to trend down.  Patient afebrile.  IV doxycycline has been discontinued.  Patient has been transitioned from IV Zosyn to oral Augmentin per general surgery.  Repeat CT abdomen and pelvis pending.  Patient's diet has been advanced to a soft diet per general surgery.  General surgery and GYN following and I appreciate the input and recommendations.    #2 rhinitis Stable.  Continue inhalers.  3.  Tachycardia Likely secondary to dehydration, and pain.  Improving.  Continue supportive care.    DVT prophylaxis: Lovenox Code Status: Full Family Communication: Updated patient and husband at bedside. Disposition Plan: Home when medically stable, clinical improvement and per general surgery.   Consultants:   General surgery: Dr. Windle Guard 01/12/2018  OB/GYN: Dr. Ilda Basset 01/14/2018  Procedures:   CT renal stone protocol 01/12/2018  Transvaginal/transabdominal ultrasound/pelvic Dopplers 01/12/2018  Antimicrobials:   IV doxycycline 01/12/2018>>>> 01/14/2018  IV Zosyn 01/12/2018>>>>>> 01/15/2018  Augmentin  01/15/2018   Subjective: Patient states improvement with lower abdominal pain.  Patient states tolerated full liquid diet.  No nausea or emesis.  Feeling much better than on admission.    Objective: Vitals:   01/14/18 0417 01/14/18 1506 01/14/18 2002 01/15/18 0457  BP: 133/86 132/84 130/84 126/88  Pulse: (!) 106 93 (!) 135 86  Resp: 18 20 18 19   Temp: 98.9 F (37.2 C) 98 F (36.7 C) 98.5 F (36.9 C) 97.8 F (36.6 C)  TempSrc: Oral Oral    SpO2: 97% 100% 100% 97%  Weight:      Height:        Intake/Output Summary (Last 24 hours) at 01/15/2018 1139 Last data filed at 01/15/2018 0555 Gross per 24 hour  Intake 1672.62 ml  Output 1 ml  Net 1671.62 ml   Filed Weights   01/12/18 0847  Weight: 77.6 kg    Examination:  General exam: NAD Respiratory system: CTAB. No wheezing, no crackles, no rhonchi. Respiratory effort normal. Cardiovascular system: Regular rate and rhythm no murmurs rubs or gallops.  No JVD.  No lower extremity edema.  Gastrointestinal system: Abdomen is soft, nondistended, decreased tenderness to palpation in the lower abdomen.  No rebound.  No guarding.   Central nervous system: Alert and oriented. No focal neurological deficits. Extremities: Symmetric 5 x 5 power. Skin: No rashes, lesions or ulcers Psychiatry: Judgement and insight appear normal. Mood & affect appropriate.     Data Reviewed: I have personally reviewed following labs and imaging studies  CBC: Recent Labs  Lab 01/12/18 0923 01/13/18 0631 01/14/18 0624 01/15/18 0605  WBC 16.4* 13.8* 14.3* 11.2*  NEUTROABS 13.0*  --  12.0*  --   HGB 14.2 12.9 11.8* 11.5*  HCT 41.1  37.6 35.0* 33.7*  MCV 81.1 81.9 81.8 82.2  PLT 243 218 237 295   Basic Metabolic Panel: Recent Labs  Lab 01/12/18 0923 01/13/18 0631 01/14/18 0624 01/15/18 0605  NA 136 138 139 138  K 3.7 4.0 3.5 3.8  CL 99 104 105 104  CO2 25 26 23 27   GLUCOSE 126* 114* 113* 106*  BUN 7 7 5* <5*  CREATININE 0.72 0.59 0.69  0.56  CALCIUM 8.8* 8.5* 8.6* 8.6*  MG  --   --  2.1 2.0   GFR: Estimated Creatinine Clearance: 87.6 mL/min (by C-G formula based on SCr of 0.56 mg/dL). Liver Function Tests: Recent Labs  Lab 01/12/18 0923 01/13/18 0631 01/14/18 0624 01/15/18 0605  AST 37 42* 27 18  ALT 46* 65* 52* 39  ALKPHOS 91 100 108 100  BILITOT 1.0 1.3* 1.0 0.7  PROT 8.0 7.0 6.6 6.3*  ALBUMIN 3.8 3.1* 2.9* 2.7*   Recent Labs  Lab 01/12/18 0923  LIPASE 23   No results for input(s): AMMONIA in the last 168 hours. Coagulation Profile: No results for input(s): INR, PROTIME in the last 168 hours. Cardiac Enzymes: No results for input(s): CKTOTAL, CKMB, CKMBINDEX, TROPONINI in the last 168 hours. BNP (last 3 results) No results for input(s): PROBNP in the last 8760 hours. HbA1C: No results for input(s): HGBA1C in the last 72 hours. CBG: No results for input(s): GLUCAP in the last 168 hours. Lipid Profile: No results for input(s): CHOL, HDL, LDLCALC, TRIG, CHOLHDL, LDLDIRECT in the last 72 hours. Thyroid Function Tests: No results for input(s): TSH, T4TOTAL, FREET4, T3FREE, THYROIDAB in the last 72 hours. Anemia Panel: No results for input(s): VITAMINB12, FOLATE, FERRITIN, TIBC, IRON, RETICCTPCT in the last 72 hours. Sepsis Labs: Recent Labs  Lab 01/12/18 1342 01/14/18 0624  LATICACIDVEN 1.79 0.7    Recent Results (from the past 240 hour(s))  Urine culture     Status: None   Collection Time: 01/08/18  9:05 AM  Result Value Ref Range Status   MICRO NUMBER: [id number]  Final   SPECIMEN QUALITY: ADEQUATE  Final   Sample Source URINE  Final   STATUS: FINAL  Final   Result:   Final    Three or more organisms present, each greater than 10,000 cu/mL. May represent normal flora contamination from external genitalia. No further testing is required.  Urine culture     Status: None   Collection Time: 01/12/18  9:56 AM  Result Value Ref Range Status   Specimen Description   Final    URINE, CLEAN  CATCH Performed at [facility], [address] [place], [place] [address]    Special Requests   Final    NONE Performed at [facility], [address] [place], [place] [address]    Culture   Final    NO GROWTH < 24 HOURS Performed at [facility], [place] [address], [place], [place] [address]    Report Status 01/13/2018 FINAL  Final  Wet prep, genital     Status: Abnormal   Collection Time: 01/12/18 11:10 AM  Result Value Ref Range Status   Yeast Wet Prep HPF POC NONE SEEN NONE SEEN Final   Trich, Wet Prep NONE SEEN NONE SEEN Final   Clue Cells Wet Prep HPF POC NONE SEEN NONE SEEN Final   WBC, Wet Prep HPF POC MANY (A) NONE SEEN Final   Sperm NONE SEEN  Final    Comment: Performed at  [facility], [address] [place], [place] [address]  Culture, blood (routine x 2)     Status: None (Preliminary result)   Collection Time: 01/12/18  5:39 PM  Result Value Ref Range Status   Specimen Description   Final    BLOOD LEFT HAND Performed at [facility] [place] [address], [place], [place] [address]    Special Requests   Final    BOTTLES DRAWN AEROBIC AND ANAEROBIC Blood Culture adequate volume Performed at [facility] [address] [place], [place] [address]    Culture   Final    NO GROWTH 3 DAYS Performed at [facility], [place] [address] [place], [place] [address]    Report Status PENDING  Incomplete  Culture, blood (routine x 2)     Status: None (Preliminary result)   Collection Time: 01/12/18  5:45 PM  Result Value Ref Range Status   Specimen Description   Final    BLOOD LEFT ANTECUBITAL Performed at [facility] [address] [place], [place] [address]    Special Requests   Final    BOTTLES DRAWN AEROBIC AND ANAEROBIC Blood Culture adequate volume Performed at [facility] [address], [place], [place] [address]    Culture   Final    NO  GROWTH 3 DAYS Performed at [facility], [place] [address] [place], [place] [address]    Report Status PENDING  Incomplete         Radiology Studies: No results found.      Scheduled Meds: . amoxicillin-clavulanate  1 tablet Oral Q12H  . enoxaparin (LOVENOX) injection  40 mg Subcutaneous Q24H  . fluticasone  2 spray Each Nare Daily  . loratadine  10 mg Oral Daily   Continuous Infusions:    LOS: 3 days    Time spent: 35 minutes    Irine Seal, MD [facility] Pager [phone]  If 7PM-7AM, please contact night-coverage [email or web address] Password [facility] 01/15/2018, 11:39 AM

## 2018-01-15 NOTE — Care Management Note (Signed)
Case Management Note  Patient Details  Name: Megan Gibson MRN: [id number] Date of Birth: 1970-08-15  Subjective/Objective:                  CC: feeling better Pt reports she still has some mild pain but feels much improved from a few days ago. Would like to shower. Had some diarrhea overnight. Tolerating FLD.   Objective: Vital signs in last 24 hours: Temp:  [97.8 F (36.6 C)-98.5 F (36.9 C)] 97.8 F (36.6 C) (08/22 0457) Pulse Rate:  [86-135] 86 (08/22 0457) Resp:  [18-20] 19 (08/22 0457) BP: (126-132)/(84-88) 126/88 (08/22 0457) SpO2:  [97 %-100 %] 97 % (08/22 0457) Last BM Date: 01/12/18  Action/Plan: Intra-abdominal inflammation/phlegmon- felt most likely to be diverticulitis - No drainable abscess - WBC 11, afebrile- transition to PO abx and repeat CBC in AM - advance to soft diet  Expected Discharge Date:  (unknown)               Expected Discharge Plan:     In-House Referral:     Discharge planning Services     Post Acute Care Choice:    Choice offered to:     DME Arranged:    DME Agency:     HH Arranged:    HH Agency:     Status of Service:     If discussed at [employer], dates discussed:    Additional Comments:  Leeroy Cha, RN 01/15/2018, 12:56 PM

## 2018-01-16 DIAGNOSIS — R1030 Lower abdominal pain, unspecified: Secondary | ICD-10-CM

## 2018-01-16 LAB — CBC
HCT: 35 % — ABNORMAL LOW (ref 36.0–46.0)
Hemoglobin: 11.8 g/dL — ABNORMAL LOW (ref 12.0–15.0)
MCH: 27.7 pg (ref 26.0–34.0)
MCHC: 33.7 g/dL (ref 30.0–36.0)
MCV: 82.2 fL (ref 78.0–100.0)
Platelets: 336 10*3/uL (ref 150–400)
RBC: 4.26 MIL/uL (ref 3.87–5.11)
RDW: 12.4 % (ref 11.5–15.5)
WBC: 10 10*3/uL (ref 4.0–10.5)

## 2018-01-16 LAB — BASIC METABOLIC PANEL
Anion gap: 8 (ref 5–15)
BUN: <5 mg/dL — ABNORMAL LOW (ref 6–20)
CO2: 28 mmol/L (ref 22–32)
Calcium: 8.6 mg/dL — ABNORMAL LOW (ref 8.9–10.3)
Chloride: 104 mmol/L (ref 98–111)
Creatinine, Ser: 0.62 mg/dL (ref 0.44–1.00)
GFR calc Af Amer: >60 mL/min (ref >60)
GFR calc non Af Amer: >60 mL/min (ref >60)
Glucose, Bld: 102 mg/dL — ABNORMAL HIGH (ref 70–99)
Potassium: 3.6 mmol/L (ref 3.5–5.1)
Sodium: 140 mmol/L (ref 135–145)

## 2018-01-16 MED ORDER — TRAMADOL HCL 50 MG PO TABS: 50.0000 mg | ORAL_TABLET | Freq: Four times a day (QID) | ORAL | 0 refills | Status: DC | PRN

## 2018-01-16 MED ORDER — ACETAMINOPHEN 500 MG PO TABS: 1000.0000 mg | ORAL_TABLET | Freq: Four times a day (QID) | ORAL | 0 refills | Status: AC | PRN

## 2018-01-16 MED ORDER — ACETAMINOPHEN 500 MG PO TABS: 1000.0000 mg | ORAL_TABLET | Freq: Four times a day (QID) | ORAL | Status: DC | PRN

## 2018-01-16 MED ORDER — SACCHAROMYCES BOULARDII 250 MG PO CAPS: 250.0000 mg | ORAL_CAPSULE | Freq: Two times a day (BID) | ORAL | Status: DC

## 2018-01-16 MED ORDER — SACCHAROMYCES BOULARDII 250 MG PO CAPS
250.0000 mg | ORAL_CAPSULE | Freq: Two times a day (BID) | ORAL | Status: DC
  Administered 2018-01-16: 250 mg via ORAL
  Filled 2018-01-16: qty 1

## 2018-01-16 MED ORDER — AMOXICILLIN-POT CLAVULANATE 875-125 MG PO TABS: 1.0000 | ORAL_TABLET | Freq: Two times a day (BID) | ORAL | 0 refills | Status: AC

## 2018-01-16 MED ORDER — POTASSIUM CHLORIDE CRYS ER 20 MEQ PO TBCR
40.0000 meq | EXTENDED_RELEASE_TABLET | Freq: Once | ORAL | Status: AC
  Administered 2018-01-16: 40 meq via ORAL
  Filled 2018-01-16: qty 2

## 2018-01-16 NOTE — Progress Notes (Signed)
Per pt he has a walker at home.

## 2018-01-16 NOTE — Care Management Note (Signed)
Case Management Note  Patient Details  Name: Megan Gibson MRN: [id number] Date of Birth: 12/24/1970  Subjective/Objective:                  discharge  Action/Plan: No hhc or dme ordered/no cm needs/self care/will sign off  Expected Discharge Date:  01/16/18               Expected Discharge Plan:  Home/Self Care  In-House Referral:     Discharge planning Services     Post Acute Care Choice:    Choice offered to:     DME Arranged:    DME Agency:     HH Arranged:    [place] Agency:     Status of Service:  Completed, signed off  If discussed at [employer] Meetings, dates discussed:    Additional Comments:  Leeroy Cha, RN 01/16/2018, 12:39 PM

## 2018-01-16 NOTE — Progress Notes (Addendum)
CC:  Abdominal pain  Subjective: She feels better, still has some pain but controlling with Tylenol and Tramadol.  She took 2 Tramadol yesterday.    Objective: Vital signs in last 24 hours: Temp:  [98 F (36.7 C)-98.7 F (37.1 C)] 98.7 F (37.1 C) (08/23 0515) Pulse Rate:  [86-98] 86 (08/23 0515) Resp:  [16-18] 16 (08/23 0515) BP: (128-137)/(79-96) 128/79 (08/23 0515) SpO2:  [98 %-100 %] 98 % (08/23 0515) Last BM Date: 01/13/18 120 PO recorded Urine x 4 Stool x 1 Afebrile, VSS WBC dow to 10K H/H stable/BMP stable CT 8/22:  Trace bilateral pleural effusions and pleuralthickening noted in the lower thorax bilaterally, new compared to the prior examination.  The sigmoid colon is relatively decompressed. Within this region, there does appear to be some mural thickening, however, this may be simply reflection of under distention of this portion of the colon. Extensive inflammatory changes are noted in the surrounding mesocolon. There also appears to be mild hypervascularity in the sigmoid mesocolon. Normal appendix.Small amount of free fluid throughout the pelvis, increased compared to the prior study. This is associated with extensive surrounding inflammatory changes in the adjacent fat of the small bowel mesentery and sigmoid mesocolon. Enhancement of the peritoneal membranes are noted throughout the low anatomic pelvis indicative of focal peritonitis. At this time, there is no large well-defined rim enhancing fluid collections identified to strongly suggest abscess. No pneumoperitoneum. WHQ:PRFFMB peritonitis throughout the anatomic pelvis. The source of this is uncertain. However, there is no definite diverticular disease. Mild subjective thickening of the sigmoid colon is favored to be reactive rather than a primary process such as colitis. Additionally, other than a small corpus luteum cyst in the left ovary, the ovaries and fallopian tubes are generally unremarkable in appearance.  Accordingly, this is favored to reflect a primary inflammatory process such as pelvic inflammatory disease.  Interval development of trace volume of pleural fluid and trace amount of pleural thickening in the lower thorax bilaterally    Intake/Output from previous day: 08/22 0701 - 08/23 0700 In: 120 [P.O.:120] Out: -  Intake/Output this shift: No intake/output data recorded.  General appearance: alert, cooperative and no distress Resp: clear to auscultation bilaterally GI: soft, still somewhat sore, but pain is much better.  + BS, tolerating soft diet, + BM, slightly loose.    Lab Results:  Recent Labs    01/15/18 0605 01/16/18 0550  WBC 11.2* 10.0  HGB 11.5* 11.8*  HCT 33.7* 35.0*  PLT 259 336    BMET Recent Labs    01/15/18 0605 01/16/18 0550  NA 138 140  K 3.8 3.6  CL 104 104  CO2 27 28  GLUCOSE 106* 102*  BUN <5* <5*  CREATININE 0.56 0.62  CALCIUM 8.6* 8.6*   PT/INR No results for input(s): LABPROT, INR in the last 72 hours.  Recent Labs  Lab 01/12/18 0923 01/13/18 0631 01/14/18 0624 01/15/18 0605  AST 37 42* 27 18  ALT 46* 65* 52* 39  ALKPHOS 91 100 108 100  BILITOT 1.0 1.3* 1.0 0.7  PROT 8.0 7.0 6.6 6.3*  ALBUMIN 3.8 3.1* 2.9* 2.7*     Lipase     Component Value Date/Time   LIPASE 23 01/12/2018 0923     Medications: . amoxicillin-clavulanate  1 tablet Oral Q12H  . enoxaparin (LOVENOX) injection  40 mg Subcutaneous Q24H  . fluticasone  2 spray Each Nare Daily  . loratadine  10 mg Oral Daily  . potassium chloride  40 mEq Oral Once   Anti-infectives (From admission, onward)   Start     Dose/Rate Route Frequency Ordered Stop   01/15/18 1200  amoxicillin-clavulanate (AUGMENTIN) 875-125 MG per tablet 1 tablet     1 tablet Oral Every 12 hours 01/15/18 1046     01/12/18 2100  piperacillin-tazobactam (ZOSYN) IVPB 3.375 g  Status:  Discontinued     3.375 g 12.5 mL/hr over 240 Minutes Intravenous Every 8 hours 01/12/18 1822 01/15/18 1046    01/12/18 1800  doxycycline (VIBRAMYCIN) 100 mg in sodium chloride 0.9 % 250 mL IVPB  Status:  Discontinued     100 mg 125 mL/hr over 120 Minutes Intravenous Every 12 hours 01/12/18 1722 01/14/18 1204   01/12/18 1330  piperacillin-tazobactam (ZOSYN) IVPB 3.375 g     3.375 g 100 mL/hr over 30 Minutes Intravenous  Once 01/12/18 1316 01/12/18 1414   01/12/18 1115  cefTRIAXone (ROCEPHIN) 1 g in sodium chloride 0.9 % 100 mL IVPB     1 g 200 mL/hr over 30 Minutes Intravenous  Once 01/12/18 1100 01/12/18 1159      Assessment/Plan  Intra-abdominal inflammation/phlegmon-PID versus diverticulosis - No drainable abscess - WBC 14.3, afebrile - repeat CBC in AM - tolerating CLD and feeling a little better clinically - advance to FLD - Augmenti started 8/22 - recommend 2 more week of outpatient Augmetin FEN: IV fluids/soft diet this AM ID: Zosyn/doxycycline 8/19 - 8/21 Augmentin 8/22 >> DVT: Lovenox Follow-up: TBD Plan;  We reviewed the CT results and her labs, fever curve.  All are improved, we still don't have a hard solid diagnosis, but CT point to peritonitis with probable PID.  We are recommending an additional 2 weeks of Augmentin.  I will add a probiotic.  I would let her use Tylenol/NSAID and limited Ultram for pain as needed at home.  I will increase her Tylenol discussed possible use of Ibuprofen also. We recommend she follow up with PCP and GYN first.  I will put her information in the AVS so she can call us if needed also.    LOS: 4 days    JENNINGS,WILLARD 01/16/2018 [phone]     She is feeling better. Eating, walking, having bowel movements, pain controlled with minimal medications, WBC normalized and no more fevers. I have reviewed her CT scan. I do not think this is colonic in origin, I think this is coming from the left adnexa. PID vs ruptured cyst with secondary infection. Either way she is clinically improving. From my standpoint she can be discharged home with oral  antibiotics and follow up with her GYN as above.   Clovis Riley MD 01/16/2018 12:00 PM

## 2018-01-16 NOTE — Progress Notes (Signed)
Received report from [facility]. Assessment unchanged from previous assessment. Will continue to monitor

## 2018-01-16 NOTE — Discharge Summary (Signed)
Physician Discharge Summary  Megan Gibson WNU:[id number] DOB: [birth year] DOA: 01/12/2018  PCP: Emeterio Reeve, DO  Admit date: 01/12/2018 Discharge date: 01/16/2018  Time spent: 60 minutes  Recommendations for Outpatient Follow-up:  1. Follow-up with GYN in 1 to 2 weeks for further evaluation of abdominal pain/intra-abdominal phlegmon. 2. Follow-up with Emeterio Reeve, DO in 2 weeks. 3. Follow-up with [facility] as needed.   Discharge Diagnoses:  Principal Problem:   Abdominal pain Active Problems:   Anxiety   Allergic rhinitis   Diverticulitis   Tachycardia   Discharge Condition: Stable and improved  Diet recommendation: Regular  Filed Weights   01/12/18 0847  Weight: 77.6 kg    History of present illness:  Per Dr Rosine Beat is a 47 y.o. female with Past medical history of anxiety. Patient presented with complaints of abdominal pain located on the left side ongoing for last 3 days along with night sweats.  No fever no chills no nausea no vomiting no diarrhea no constipation.  She did have one loose watery BM on day of admission.  No prior episode of vaginal infection or diarrheal illness. No procedures reported as well.  ED Course: CT abdomen was positive for possible phlegmon.  Ultrasound abdomen was performed and patient was referred for admission.  At her baseline ambulates without any support And is independent for most of her ADL; manages her medication on her own.  Hospital Course:  1 intra-abdominal pain/intra-abdominal phlegmon Concern for diverticulitis versus PID.  Patient noted to have abdominal ultrasound, which was essentially negative.  Ultrasound showed 7 to 8 cm left adnexal soft tissue masslike area with increased flow concerning for phlegmon.  GC/ chlamydia was negative.  Wet prep essentially negative.  HIV nonreactive.  Patient admitted and placed on empiric IV antibiotics of IV Zosyn and IV doxycycline. WBC trended down.  Patient remained afebrile.  IV doxycycline was subsequently discontinued.   Patient improved clinically and initially was on bowel rest and subsequent subsequently started on clear liquids.  General surgery and GYN assessed the patient and follow the patient throughout the hospitalization.  It was felt per GYN that patient likely did not have PID as GC chlamydia was negative as well as wet prep essentially negative.  As patient improved clinically IV Zosyn and subsequently transition to oral Augmentin and her diet advanced to a soft diet which she tolerated.  Repeat abdominal and pelvic CT which was done was pointing towards peritonitis and probable PID however patient looked clinically better.  Patient was cleared by general surgery for discharge with outpatient follow-up with her GYN as well as her PCP.  Patient will be discharged on 2 more weeks of oral Augmentin and is to follow-up with PCP and GYN in the outpatient setting for further evaluation and management.   #2 rhinitis Stable. Maintained on inhalers.    3.  Tachycardia Likely secondary to dehydration, and pain.  Improved with hydration and supportive care.  Tachycardia resolved by day of discharge.    Procedures:  CT renal stone protocol 01/12/2018  Transvaginal/transabdominal ultrasound/pelvic Dopplers 01/12/2018  Consultations:  General surgery: Dr. Windle Guard 01/12/2018  OB/GYN: Dr. Ilda Basset 01/14/2018  Discharge Exam: Vitals:   01/15/18 1952 01/16/18 0515  BP: 132/87 128/79  Pulse: 98 86  Resp: 18 16  Temp: 98.7 F (37.1 C) 98.7 F (37.1 C)  SpO2: 100% 98%    General: NAD Cardiovascular: RRR Respiratory: CTAB Abdomen: Soft/NT/ND/+BS  Discharge Instructions   Discharge Instructions    Diet general  Complete by:  As directed    Increase activity slowly   Complete by:  As directed      Allergies as of 01/16/2018   No Known Allergies     Medication List    STOP taking these medications   buPROPion 150 MG  24 hr tablet Commonly known as:  WELLBUTRIN XL     TAKE these medications   acetaminophen 500 MG tablet Commonly known as:  TYLENOL Take 2 tablets (1,000 mg total) by mouth every 6 (six) hours as needed for mild pain, moderate pain or fever (or Fever >/= 101).   amoxicillin-clavulanate 875-125 MG tablet Commonly known as:  AUGMENTIN Take 1 tablet by mouth every 12 (twelve) hours for 14 days.   fexofenadine 180 MG tablet Commonly known as:  ALLEGRA Take 180 mg by mouth daily as needed for allergies.   fluticasone 50 MCG/ACT nasal spray Commonly known as:  FLONASE One spray nostril daily for 2 weeks then stop   Give generic   pseudoephedrine 30 MG tablet Commonly known as:  SUDAFED Take 30 mg by mouth every 4 (four) hours as needed for congestion.   saccharomyces boulardii 250 MG capsule Commonly known as:  FLORASTOR Take 1 capsule (250 mg total) by mouth 2 (two) times daily.   traMADol 50 MG tablet Commonly known as:  ULTRAM Take 1 tablet (50 mg total) by mouth every 6 (six) hours as needed for moderate pain.      No Known Allergies Follow-up Information    [facility] Follow up.   Specialty:  General Surgery Why:  We helped follow you in the hospital.  We recommend follow up with your primary care and GYN physican's first.  We will be available if you need [place], please call.   Contact information: [address] [place] [address] [phone]        Emeterio Reeve, DO. Schedule an appointment as soon as possible for a visit in 2 week(s).   Specialty:  Osteopathic Medicine Contact information: [address] [place] [address] [phone]        gyn. Schedule an appointment as soon as possible for a visit in 1 week(s).   Why:  f/u in 1-2 weeks.           The results of significant diagnostics from this hospitalization (including imaging, microbiology, ancillary and laboratory) are listed below for  reference.    Significant Diagnostic Studies: US Transvaginal Non-ob  Result Date: 01/12/2018 CLINICAL DATA:  Left elbow pain EXAM: TRANSABDOMINAL AND TRANSVAGINAL ULTRASOUND OF PELVIS DOPPLER ULTRASOUND OF OVARIES TECHNIQUE: Both transabdominal and transvaginal ultrasound examinations of the pelvis were performed. Transabdominal technique was performed for global imaging of the pelvis including uterus, ovaries, adnexal regions, and pelvic cul-de-sac. It was necessary to proceed with endovaginal exam following the transabdominal exam to visualize the endometrium and ovaries. Color and duplex Doppler ultrasound was utilized to evaluate blood flow to the ovaries. COMPARISON:  CT abdomen 01/12/2018, ultrasound pelvis 01/08/2018 FINDINGS: Uterus Measurements: 8.4 x 5 x 5.9 cm. No fibroids or other mass visualized. Endometrium Thickness: 5.7 mm.  No focal abnormality visualized. Right ovary Measurements: 2.7 x 1.4 x 2.3 cm. Normal appearance/no adnexal mass. Left ovary Measurements: 3.5 x 2.1 x 3.2 cm. 2.1 cm hypoechoic left ovarian mass with irregular walls without internal Doppler flow likely reflecting a corpus luteum cyst. 7.7 x 2.1 x 4.4 cm left adnexal soft tissue masslike area with increased Doppler flow  concerning for a phlegmon which may be secondary to diverticulitis versus pelvic inflammatory disease. The abnormality abuts the ovary. Pulsed Doppler evaluation of both ovaries demonstrates normal low-resistance arterial and venous waveforms. Other findings Moderate amount of pelvic free fluid. IMPRESSION: 1. No ovarian torsion. 2. 7.7 x 2.1 x 4.4 cm left adnexal soft tissue masslike area with increased Doppler flow concerning for a phlegmon which may be secondary to diverticulitis versus pelvic inflammatory disease/tubo-ovarian abscess. Given that the phlegmon abuts the left ovary PID/TOA would be considered more likely. 3. Moderate amount of pelvic free fluid. Electronically Signed   By: Kathreen Devoid   On:  01/12/2018 12:57   US Pelvis Complete  Result Date: 01/12/2018 CLINICAL DATA:  Left elbow pain EXAM: TRANSABDOMINAL AND TRANSVAGINAL ULTRASOUND OF PELVIS DOPPLER ULTRASOUND OF OVARIES TECHNIQUE: Both transabdominal and transvaginal ultrasound examinations of the pelvis were performed. Transabdominal technique was performed for global imaging of the pelvis including uterus, ovaries, adnexal regions, and pelvic cul-de-sac. It was necessary to proceed with endovaginal exam following the transabdominal exam to visualize the endometrium and ovaries. Color and duplex Doppler ultrasound was utilized to evaluate blood flow to the ovaries. COMPARISON:  CT abdomen 01/12/2018, ultrasound pelvis 01/08/2018 FINDINGS: Uterus Measurements: 8.4 x 5 x 5.9 cm. No fibroids or other mass visualized. Endometrium Thickness: 5.7 mm.  No focal abnormality visualized. Right ovary Measurements: 2.7 x 1.4 x 2.3 cm. Normal appearance/no adnexal mass. Left ovary Measurements: 3.5 x 2.1 x 3.2 cm. 2.1 cm hypoechoic left ovarian mass with irregular walls without internal Doppler flow likely reflecting a corpus luteum cyst. 7.7 x 2.1 x 4.4 cm left adnexal soft tissue masslike area with increased Doppler flow concerning for a phlegmon which may be secondary to diverticulitis versus pelvic inflammatory disease. The abnormality abuts the ovary. Pulsed Doppler evaluation of both ovaries demonstrates normal low-resistance arterial and venous waveforms. Other findings Moderate amount of pelvic free fluid. IMPRESSION: 1. No ovarian torsion. 2. 7.7 x 2.1 x 4.4 cm left adnexal soft tissue masslike area with increased Doppler flow concerning for a phlegmon which may be secondary to diverticulitis versus pelvic inflammatory disease/tubo-ovarian abscess. Given that the phlegmon abuts the left ovary PID/TOA would be considered more likely. 3. Moderate amount of pelvic free fluid. Electronically Signed   By: Kathreen Devoid   On: 01/12/2018 12:57   Ct Abdomen  Pelvis W Contrast  Result Date: 01/15/2018 CLINICAL DATA:  47 year old female with history of inflammation in the sigmoid colon and left adnexa noted on recent noncontrast CT examination. Followup evaluation. EXAM: CT ABDOMEN AND PELVIS WITH CONTRAST TECHNIQUE: Multidetector CT imaging of the abdomen and pelvis was performed using the standard protocol following bolus administration of intravenous contrast. CONTRAST:  <See Chart> ISOVUE-300 IOPAMIDOL (ISOVUE-300) INJECTION 61%, 76mL ISOVUE-300 IOPAMIDOL (ISOVUE-300) INJECTION 61%, 154mL OMNIPAQUE IOHEXOL 300 MG/ML SOLN COMPARISON:  CT the abdomen and pelvis without contrast 01/12/2018. FINDINGS: Lower chest: Trace bilateral pleural effusions and pleural thickening noted in the lower thorax bilaterally, new compared to the prior examination. Hepatobiliary: Focal area of fatty infiltration in the left lobe of the liver predominantly in segment 4B adjacent to the falciform ligament, similar to the prior study. No suspicious cystic or solid hepatic lesions. No intra or extrahepatic biliary ductal dilatation. Status post cholecystectomy. Pancreas: No pancreatic mass. No pancreatic ductal dilatation. No pancreatic or peripancreatic fluid or inflammatory changes. Spleen: Unremarkable. Adrenals/Urinary Tract: Bilateral kidneys and bilateral adrenal glands are normal in appearance. There is no hydroureteronephrosis. Urinary bladder is unremarkable in  appearance. Stomach/Bowel: Normal appearance of the stomach. No pathologic dilatation of small bowel or colon. The sigmoid colon is relatively decompressed. Within this region, there does appear to be some mural thickening, however, this may be simply reflection of under distention of this portion of the colon. Extensive inflammatory changes are noted in the surrounding mesocolon. There also appears to be mild hypervascularity in the sigmoid mesocolon. Normal appendix. Vascular/Lymphatic: No significant atherosclerotic  disease, aneurysm or dissection noted in the abdominal or pelvic vasculature. Circumaortic left renal vein (normal anatomical variant) incidentally noted. No lymphadenopathy identified in the abdomen or pelvis. Reproductive: Uterus is unremarkable in appearance. 1 cm rim enhancing structure in the left ovary, strongly favored to represent a small corpus luteum cyst. Right ovary is unremarkable in appearance. Other: Small amount of free fluid throughout the pelvis, increased compared to the prior study. This is associated with extensive surrounding inflammatory changes in the adjacent fat of the small bowel mesentery and sigmoid mesocolon. Enhancement of the peritoneal membranes are noted throughout the low anatomic pelvis indicative of focal peritonitis. At this time, there is no large well-defined rim enhancing fluid collections identified to strongly suggest abscess. No pneumoperitoneum. Musculoskeletal: There are no aggressive appearing lytic or blastic lesions noted in the visualized portions of the skeleton. IMPRESSION: 1. Severe peritonitis throughout the anatomic pelvis. The source of this is uncertain. However, there is no definite diverticular disease. Mild subjective thickening of the sigmoid colon is favored to be reactive rather than a primary process such as colitis. Additionally, other than a small corpus luteum cyst in the left ovary, the ovaries and fallopian tubes are generally unremarkable in appearance. Accordingly, this is favored to reflect a primary inflammatory process such as pelvic inflammatory disease. Further clinical evaluation is recommended. 2. Interval development of trace volume of pleural fluid and trace amount of pleural thickening in the lower thorax bilaterally. Electronically Signed   By: Vinnie Langton M.D.   On: 01/15/2018 16:22   [place] Art/ven Flow Abd Pelv Doppler  Result Date: 01/12/2018 CLINICAL DATA:  Left elbow pain EXAM: TRANSABDOMINAL AND TRANSVAGINAL ULTRASOUND OF  PELVIS DOPPLER ULTRASOUND OF OVARIES TECHNIQUE: Both transabdominal and transvaginal ultrasound examinations of the pelvis were performed. Transabdominal technique was performed for global imaging of the pelvis including uterus, ovaries, adnexal regions, and pelvic cul-de-sac. It was necessary to proceed with endovaginal exam following the transabdominal exam to visualize the endometrium and ovaries. Color and duplex Doppler ultrasound was utilized to evaluate blood flow to the ovaries. COMPARISON:  CT abdomen 01/12/2018, ultrasound pelvis 01/08/2018 FINDINGS: Uterus Measurements: 8.4 x 5 x 5.9 cm. No fibroids or other mass visualized. Endometrium Thickness: 5.7 mm.  No focal abnormality visualized. Right ovary Measurements: 2.7 x 1.4 x 2.3 cm. Normal appearance/no adnexal mass. Left ovary Measurements: 3.5 x 2.1 x 3.2 cm. 2.1 cm hypoechoic left ovarian mass with irregular walls without internal Doppler flow likely reflecting a corpus luteum cyst. 7.7 x 2.1 x 4.4 cm left adnexal soft tissue masslike area with increased Doppler flow concerning for a phlegmon which may be secondary to diverticulitis versus pelvic inflammatory disease. The abnormality abuts the ovary. Pulsed Doppler evaluation of both ovaries demonstrates normal low-resistance arterial and venous waveforms. Other findings Moderate amount of pelvic free fluid. IMPRESSION: 1. No ovarian torsion. 2. 7.7 x 2.1 x 4.4 cm left adnexal soft tissue masslike area with increased Doppler flow concerning for a phlegmon which may be secondary to diverticulitis versus pelvic inflammatory disease/tubo-ovarian abscess. Given that the phlegmon abuts  the left ovary PID/TOA would be considered more likely. 3. Moderate amount of pelvic free fluid. Electronically Signed   By: Kathreen Devoid   On: 01/12/2018 12:57   Ct Renal Stone Study  Result Date: 01/12/2018 CLINICAL DATA:  Left flank and left lower quadrant pain. EXAM: CT ABDOMEN AND PELVIS WITHOUT CONTRAST TECHNIQUE:  Multidetector CT imaging of the abdomen and pelvis was performed following the standard protocol without IV contrast. COMPARISON:  Pelvic ultrasound 01/08/2018 FINDINGS: Lower chest: Lung bases are clear. No effusions. Heart is normal size. Hepatobiliary: No focal liver abnormality is seen. Status post cholecystectomy. No biliary dilatation. Pancreas: No focal abnormality or ductal dilatation. Spleen: No focal abnormality.  Normal size. Adrenals/Urinary Tract: No adrenal abnormality. No focal renal abnormality. No stones or hydronephrosis. Urinary bladder is unremarkable. Stomach/Bowel: There is inflammation noted in the left lower quadrant adjacent to the mid sigmoid colon and the left ovary. It is unclear if this is arising from the left ovary or from the sigmoid colon. I see no definite diverticula in the area. No evidence of bowel obstruction. Appendix is normal. Vascular/Lymphatic: No evidence of aneurysm or adenopathy. Reproductive: Uterus and right adnexa unremarkable. There is fullness of the left ovary with surrounding inflammation around the left ovary and the adjacent mid sigmoid colon. It is unclear if this is arising from the sigmoid colon or the left ovary. Other: Trace free fluid in the pelvis.  No free air. Musculoskeletal: No acute bony abnormality. IMPRESSION: Abnormal inflammatory stranding in the left lower quadrant adjacent to the mid sigmoid colon and the left ovary. The left ovary appears prominent as well. Differential considerations would include colitis/diverticulitis of the sigmoid colon, pelvic inflammatory disease, or ovarian torsion. The left ovary appeared normal on recent pelvic ultrasound, but repeat study with Doppler may be beneficial to exclude ovarian origin. Electronically Signed   By: Rolm Baptise M.D.   On: 01/12/2018 10:53   US Pelvic Complete With Transvaginal  Result Date: 01/08/2018 CLINICAL DATA:  Left lower quadrant pain, dull pressure EXAM: TRANSABDOMINAL AND  TRANSVAGINAL ULTRASOUND OF PELVIS TECHNIQUE: Both transabdominal and transvaginal ultrasound examinations of the pelvis were performed. Transabdominal technique was performed for global imaging of the pelvis including uterus, ovaries, adnexal regions, and pelvic cul-de-sac. It was necessary to proceed with endovaginal exam following the transabdominal exam to visualize the endometrium and ovaries. COMPARISON:  None FINDINGS: Uterus Measurements: 8.8 x 4.6 x 4.8 cm. No fibroids or other mass visualized. Endometrium Thickness: 4.3 mm.  No focal abnormality visualized. Right ovary Measurements: 2.1 x 1.4 x 1.5 cm. Normal appearance/no adnexal mass. Left ovary Measurements: 3.5 x 2.1 x 2 cm.  Normal appearance/no adnexal mass. Other findings Trace pelvic free fluid. IMPRESSION: Normal pelvic ultrasound. Electronically Signed   By: Kathreen Devoid   On: 01/08/2018 10:36    Microbiology: Recent Results (from the past 240 hour(s))  Urine culture     Status: None   Collection Time: 01/08/18  9:05 AM  Result Value Ref Range Status   MICRO NUMBER: [id number]  Final   SPECIMEN QUALITY: ADEQUATE  Final   Sample Source URINE  Final   STATUS: FINAL  Final   Result:   Final    Three or more organisms present, each greater than 10,000 cu/mL. May represent normal flora contamination from external genitalia. No further testing is required.  Urine culture     Status: None   Collection Time: 01/12/18  9:56 AM  Result Value Ref Range Status  Specimen Description   Final    URINE, CLEAN CATCH Performed at [facility], [address] [place], [place] [address]    Special Requests   Final    NONE Performed at [facility], [address] [place], [place] [address]    Culture   Final    NO GROWTH < 24 HOURS Performed at [facility], [place] [address] [place], [place] [address]    Report Status 01/13/2018 FINAL  Final  Wet prep, genital     Status: Abnormal   Collection Time: 01/12/18  11:10 AM  Result Value Ref Range Status   Yeast Wet Prep HPF POC NONE SEEN NONE SEEN Final   Trich, Wet Prep NONE SEEN NONE SEEN Final   Clue Cells Wet Prep HPF POC NONE SEEN NONE SEEN Final   WBC, Wet Prep HPF POC MANY (A) NONE SEEN Final   Sperm NONE SEEN  Final    Comment: Performed at [facility], [address] [place], [place] [address]  Culture, blood (routine x 2)     Status: None (Preliminary result)   Collection Time: 01/12/18  5:39 PM  Result Value Ref Range Status   Specimen Description   Final    BLOOD LEFT HAND Performed at [facility], [place] [address] [place], [place] [address]    Special Requests   Final    BOTTLES DRAWN AEROBIC AND ANAEROBIC Blood Culture adequate volume Performed at [facility] [address], [place], [place] [address]    Culture   Final    NO GROWTH 4 DAYS Performed at [facility], [place] [address] [place], [place] [address]    Report Status PENDING  Incomplete  Culture, blood (routine x 2)     Status: None (Preliminary result)   Collection Time: 01/12/18  5:45 PM  Result Value Ref Range Status   Specimen Description   Final    BLOOD LEFT ANTECUBITAL Performed at [facility] [address], [place], [place] [address]    Special Requests   Final    BOTTLES DRAWN AEROBIC AND ANAEROBIC Blood Culture adequate volume Performed at [facility] [address] [place], [place] [address]    Culture   Final    NO GROWTH 4 DAYS Performed at [facility], [place] [address], [place],  [address]    Report Status PENDING  Incomplete     Labs: Basic Metabolic Panel: Recent Labs  Lab 01/12/18 0923 01/13/18 0631 01/14/18 0624 01/15/18 0605 01/16/18 0550  NA 136 138 139 138 140  K 3.7 4.0 3.5 3.8 3.6  CL 99 104 105 104 104  CO2 25 26 23 27 28   GLUCOSE 126* 114* 113* 106* 102*  BUN 7 7 5* <5* <5*  CREATININE 0.72 0.59 0.69 0.56 0.62   CALCIUM 8.8* 8.5* 8.6* 8.6* 8.6*  MG  --   --  2.1 2.0  --    Liver Function Tests: Recent Labs  Lab 01/12/18 0923 01/13/18 0631 01/14/18 0624 01/15/18 0605  AST 37 42* 27 18  ALT 46* 65* 52* 39  ALKPHOS 91 100 108 100  BILITOT 1.0 1.3* 1.0 0.7  PROT 8.0 7.0 6.6 6.3*  ALBUMIN 3.8 3.1* 2.9* 2.7*   Recent Labs  Lab 01/12/18 0923  LIPASE 23   No results for input(s): AMMONIA in the last 168 hours. CBC: Recent Labs  Lab 01/12/18 0923 01/13/18 0631 01/14/18 0624 01/15/18 0605 01/16/18 0550  WBC 16.4* 13.8* 14.3* 11.2* 10.0  NEUTROABS 13.0*  --  12.0*  --   --   HGB 14.2 12.9 11.8* 11.5* 11.8*  HCT 41.1 37.6 35.0* 33.7* 35.0*  MCV 81.1 81.9 81.8 82.2 82.2  PLT 243 218 237 259 336   Cardiac Enzymes: No results for input(s): CKTOTAL, CKMB, CKMBINDEX, TROPONINI in the last 168 hours. BNP: BNP (last 3 results) No results for input(s): BNP in the last 8760 hours.  ProBNP (last 3 results) No results for input(s): PROBNP in the last 8760 hours.  CBG: No results for input(s): GLUCAP in the last 168 hours.     Signed:  Irine Seal MD.  [facility] 01/16/2018, 1:13 PM

## 2018-01-17 LAB — CULTURE, BLOOD (ROUTINE X 2)
Culture: NO GROWTH
Culture: NO GROWTH
Special Requests: ADEQUATE
Special Requests: ADEQUATE

## 2018-11-19 HISTORY — PX: COLONOSCOPY: SHX174

## 2019-03-14 ENCOUNTER — Encounter (HOSPITAL_BASED_OUTPATIENT_CLINIC_OR_DEPARTMENT_OTHER): Payer: Self-pay

## 2019-03-14 ENCOUNTER — Emergency Department (HOSPITAL_BASED_OUTPATIENT_CLINIC_OR_DEPARTMENT_OTHER): Payer: BLUE CROSS/BLUE SHIELD

## 2019-03-14 ENCOUNTER — Inpatient Hospital Stay (HOSPITAL_BASED_OUTPATIENT_CLINIC_OR_DEPARTMENT_OTHER)
Admission: EM | Admit: 2019-03-14 | Discharge: 2019-03-16 | DRG: 759 | Disposition: A | Discharge status: Home / Self Care | Payer: BLUE CROSS/BLUE SHIELD | Attending: Family Medicine | Admitting: Family Medicine

## 2019-03-14 ENCOUNTER — Other Ambulatory Visit: Payer: Self-pay

## 2019-03-14 DIAGNOSIS — R6510 Systemic inflammatory response syndrome (SIRS) of non-infectious origin without acute organ dysfunction: Secondary | ICD-10-CM

## 2019-03-14 DIAGNOSIS — Z79891 Long term (current) use of opiate analgesic: Secondary | ICD-10-CM | POA: Diagnosis not present

## 2019-03-14 DIAGNOSIS — N730 Acute parametritis and pelvic cellulitis: Secondary | ICD-10-CM | POA: Diagnosis not present

## 2019-03-14 DIAGNOSIS — K5900 Constipation, unspecified: Secondary | ICD-10-CM | POA: Diagnosis present

## 2019-03-14 DIAGNOSIS — F419 Anxiety disorder, unspecified: Secondary | ICD-10-CM | POA: Diagnosis present

## 2019-03-14 DIAGNOSIS — Z79899 Other long term (current) drug therapy: Secondary | ICD-10-CM | POA: Diagnosis not present

## 2019-03-14 DIAGNOSIS — Z20828 Contact with and (suspected) exposure to other viral communicable diseases: Secondary | ICD-10-CM | POA: Diagnosis present

## 2019-03-14 DIAGNOSIS — R1030 Lower abdominal pain, unspecified: Secondary | ICD-10-CM | POA: Diagnosis not present

## 2019-03-14 DIAGNOSIS — Z9049 Acquired absence of other specified parts of digestive tract: Secondary | ICD-10-CM | POA: Diagnosis not present

## 2019-03-14 DIAGNOSIS — N938 Other specified abnormal uterine and vaginal bleeding: Secondary | ICD-10-CM | POA: Diagnosis present

## 2019-03-14 DIAGNOSIS — N7093 Salpingitis and oophoritis, unspecified: Secondary | ICD-10-CM | POA: Diagnosis present

## 2019-03-14 DIAGNOSIS — R319 Hematuria, unspecified: Secondary | ICD-10-CM | POA: Diagnosis present

## 2019-03-14 DIAGNOSIS — Z803 Family history of malignant neoplasm of breast: Secondary | ICD-10-CM

## 2019-03-14 DIAGNOSIS — J309 Allergic rhinitis, unspecified: Secondary | ICD-10-CM | POA: Diagnosis present

## 2019-03-14 DIAGNOSIS — G4700 Insomnia, unspecified: Secondary | ICD-10-CM | POA: Diagnosis present

## 2019-03-14 DIAGNOSIS — Z8041 Family history of malignant neoplasm of ovary: Secondary | ICD-10-CM | POA: Diagnosis not present

## 2019-03-14 LAB — COMPREHENSIVE METABOLIC PANEL
ALT: 27 U/L (ref 0–44)
AST: 24 U/L (ref 15–41)
Albumin: 3.9 g/dL (ref 3.5–5.0)
Alkaline Phosphatase: 77 U/L (ref 38–126)
Anion gap: 13 (ref 5–15)
BUN: 10 mg/dL (ref 6–20)
CO2: 19 mmol/L — ABNORMAL LOW (ref 22–32)
Calcium: 9 mg/dL (ref 8.9–10.3)
Chloride: 105 mmol/L (ref 98–111)
Creatinine, Ser: 0.77 mg/dL (ref 0.44–1.00)
GFR calc Af Amer: >60 mL/min (ref >60)
GFR calc non Af Amer: >60 mL/min (ref >60)
Glucose, Bld: 172 mg/dL — ABNORMAL HIGH (ref 70–99)
Potassium: 3.7 mmol/L (ref 3.5–5.1)
Sodium: 137 mmol/L (ref 135–145)
Total Bilirubin: 0.8 mg/dL (ref 0.3–1.2)
Total Protein: 7.8 g/dL (ref 6.5–8.1)

## 2019-03-14 LAB — CREATININE, SERUM
Creatinine, Ser: 0.7 mg/dL (ref 0.44–1.00)
GFR calc Af Amer: >60 mL/min (ref >60)
GFR calc non Af Amer: >60 mL/min (ref >60)

## 2019-03-14 LAB — CBC
HCT: 36.7 % (ref 36.0–46.0)
Hemoglobin: 12.2 g/dL (ref 12.0–15.0)
MCH: 27.8 pg (ref 26.0–34.0)
MCHC: 33.2 g/dL (ref 30.0–36.0)
MCV: 83.6 fL (ref 80.0–100.0)
Platelets: 208 10*3/uL (ref 150–400)
RBC: 4.39 MIL/uL (ref 3.87–5.11)
RDW: 12.3 % (ref 11.5–15.5)
WBC: 14.9 10*3/uL — ABNORMAL HIGH (ref 4.0–10.5)
nRBC: 0 % (ref 0.0–0.2)

## 2019-03-14 LAB — WET PREP, GENITAL
Clue Cells Wet Prep HPF POC: NONE SEEN
Sperm: NONE SEEN
Trich, Wet Prep: NONE SEEN
Yeast Wet Prep HPF POC: NONE SEEN

## 2019-03-14 LAB — URINALYSIS, ROUTINE W REFLEX MICROSCOPIC
Glucose, UA: NEGATIVE mg/dL
Ketones, ur: 15 mg/dL — AB
Nitrite: NEGATIVE
Protein, ur: 100 mg/dL — AB
Specific Gravity, Urine: 1.025 (ref 1.005–1.030)
pH: 6 (ref 5.0–8.0)

## 2019-03-14 LAB — CBC WITH DIFFERENTIAL/PLATELET
Abs Immature Granulocytes: 0.09 10*3/uL — ABNORMAL HIGH (ref 0.00–0.07)
Basophils Absolute: 0 10*3/uL (ref 0.0–0.1)
Basophils Relative: 0 %
Eosinophils Absolute: 0 10*3/uL (ref 0.0–0.5)
Eosinophils Relative: 0 %
HCT: 42.9 % (ref 36.0–46.0)
Hemoglobin: 14.1 g/dL (ref 12.0–15.0)
Immature Granulocytes: 1 %
Lymphocytes Relative: 6 %
Lymphs Abs: 1.1 10*3/uL (ref 0.7–4.0)
MCH: 27.4 pg (ref 26.0–34.0)
MCHC: 32.9 g/dL (ref 30.0–36.0)
MCV: 83.5 fL (ref 80.0–100.0)
Monocytes Absolute: 0.9 10*3/uL (ref 0.1–1.0)
Monocytes Relative: 5 %
Neutro Abs: 17 10*3/uL — ABNORMAL HIGH (ref 1.7–7.7)
Neutrophils Relative %: 88 %
Platelets: 247 10*3/uL (ref 150–400)
RBC: 5.14 MIL/uL — ABNORMAL HIGH (ref 3.87–5.11)
RDW: 12.4 % (ref 11.5–15.5)
WBC: 19.1 10*3/uL — ABNORMAL HIGH (ref 4.0–10.5)
nRBC: 0 % (ref 0.0–0.2)

## 2019-03-14 LAB — URINALYSIS, MICROSCOPIC (REFLEX): RBC / HPF: >50 RBC/hpf (ref 0–5)

## 2019-03-14 LAB — LACTIC ACID, PLASMA
Lactic Acid, Venous: 2.2 mmol/L (ref 0.5–1.9)
Lactic Acid, Venous: 1.6 mmol/L (ref 0.5–1.9)

## 2019-03-14 LAB — LIPASE, BLOOD: Lipase: 23 U/L (ref 11–51)

## 2019-03-14 LAB — SARS CORONAVIRUS 2 BY RT PCR (HOSPITAL ORDER, PERFORMED IN ~~LOC~~ HOSPITAL LAB): SARS Coronavirus 2: NEGATIVE

## 2019-03-14 LAB — PREGNANCY, URINE: Preg Test, Ur: NEGATIVE

## 2019-03-14 MED ORDER — ALUM & MAG HYDROXIDE-SIMETH 200-200-20 MG/5ML PO SUSP: 30.0000 mL | ORAL | Status: DC | PRN

## 2019-03-14 MED ORDER — MENTHOL 3 MG MT LOZG: 1.0000 | LOZENGE | OROMUCOSAL | Status: DC | PRN

## 2019-03-14 MED ORDER — METOCLOPRAMIDE HCL 5 MG/ML IJ SOLN: 10.0000 mg | Freq: Four times a day (QID) | INTRAMUSCULAR | Status: DC | PRN

## 2019-03-14 MED ORDER — ACETAMINOPHEN 325 MG PO TABS
650.0000 mg | ORAL_TABLET | Freq: Four times a day (QID) | ORAL | Status: DC | PRN
  Administered 2019-03-14 – 2019-03-15 (×2): 650 mg via ORAL
  Filled 2019-03-14 (×3): qty 2

## 2019-03-14 MED ORDER — SODIUM CHLORIDE 0.9 % IV SOLN
100.0000 mg | Freq: Two times a day (BID) | INTRAVENOUS | Status: DC
  Administered 2019-03-14 – 2019-03-16 (×4): 100 mg via INTRAVENOUS
  Filled 2019-03-14 (×6): qty 100

## 2019-03-14 MED ORDER — HYDROMORPHONE HCL 1 MG/ML IJ SOLN: 0.2000 mg | INTRAMUSCULAR | Status: DC | PRN

## 2019-03-14 MED ORDER — IOHEXOL 300 MG/ML  SOLN
100.0000 mL | Freq: Once | INTRAMUSCULAR | Status: AC | PRN
  Administered 2019-03-14: 100 mL via INTRAVENOUS

## 2019-03-14 MED ORDER — IBUPROFEN 600 MG PO TABS
600.0000 mg | ORAL_TABLET | Freq: Four times a day (QID) | ORAL | Status: DC | PRN
  Administered 2019-03-14 – 2019-03-16 (×2): 600 mg via ORAL
  Filled 2019-03-14 (×2): qty 1

## 2019-03-14 MED ORDER — MORPHINE SULFATE (PF) 4 MG/ML IV SOLN
4.0000 mg | Freq: Once | INTRAVENOUS | Status: AC
  Administered 2019-03-14: 4 mg via INTRAVENOUS
  Filled 2019-03-14: qty 1

## 2019-03-14 MED ORDER — SENNOSIDES-DOCUSATE SODIUM 8.6-50 MG PO TABS: 1.0000 | ORAL_TABLET | Freq: Every evening | ORAL | Status: DC | PRN

## 2019-03-14 MED ORDER — ENOXAPARIN SODIUM 40 MG/0.4ML ~~LOC~~ SOLN
40.0000 mg | SUBCUTANEOUS | Status: DC
  Administered 2019-03-14: 40 mg via SUBCUTANEOUS
  Filled 2019-03-14 (×2): qty 0.4

## 2019-03-14 MED ORDER — SODIUM CHLORIDE 0.9 % IV BOLUS
1000.0000 mL | Freq: Once | INTRAVENOUS | Status: AC
  Administered 2019-03-14: 1000 mL via INTRAVENOUS

## 2019-03-14 MED ORDER — OXYCODONE-ACETAMINOPHEN 5-325 MG PO TABS
1.0000 | ORAL_TABLET | ORAL | Status: DC | PRN
  Administered 2019-03-15: 1 via ORAL
  Filled 2019-03-14: qty 1

## 2019-03-14 MED ORDER — ONDANSETRON HCL 4 MG/2ML IJ SOLN
4.0000 mg | Freq: Once | INTRAMUSCULAR | Status: AC
  Administered 2019-03-14: 4 mg via INTRAVENOUS
  Filled 2019-03-14: qty 2

## 2019-03-14 MED ORDER — ONDANSETRON HCL 4 MG/2ML IJ SOLN
4.0000 mg | Freq: Three times a day (TID) | INTRAMUSCULAR | Status: DC | PRN
  Administered 2019-03-14: 4 mg via INTRAVENOUS
  Filled 2019-03-14: qty 2

## 2019-03-14 MED ORDER — SODIUM CHLORIDE 0.9 % IV SOLN
1.0000 g | Freq: Two times a day (BID) | INTRAVENOUS | Status: DC
  Administered 2019-03-14 – 2019-03-16 (×4): 1 g via INTRAVENOUS
  Filled 2019-03-14 (×5): qty 1

## 2019-03-14 MED ORDER — LACTATED RINGERS IV SOLN
INTRAVENOUS | Status: DC
  Administered 2019-03-14 – 2019-03-15 (×2): via INTRAVENOUS

## 2019-03-14 MED ORDER — GUAIFENESIN 100 MG/5ML PO SOLN
15.0000 mL | ORAL | Status: DC | PRN
  Filled 2019-03-14: qty 15

## 2019-03-14 MED ORDER — IBUPROFEN 400 MG PO TABS
600.0000 mg | ORAL_TABLET | Freq: Once | ORAL | Status: AC
  Administered 2019-03-14: 600 mg via ORAL
  Filled 2019-03-14: qty 1

## 2019-03-14 MED ORDER — SODIUM CHLORIDE 0.9 % IV SOLN
2.0000 g | Freq: Once | INTRAVENOUS | Status: AC
  Administered 2019-03-14: 2 g via INTRAVENOUS
  Filled 2019-03-14: qty 20

## 2019-03-14 MED ORDER — MORPHINE SULFATE (PF) 4 MG/ML IV SOLN
4.0000 mg | INTRAVENOUS | Status: DC | PRN
  Administered 2019-03-14: 4 mg via INTRAVENOUS
  Filled 2019-03-14: qty 1

## 2019-03-14 MED ORDER — METRONIDAZOLE IN NACL 5-0.79 MG/ML-% IV SOLN
500.0000 mg | Freq: Once | INTRAVENOUS | Status: AC
  Administered 2019-03-14: 500 mg via INTRAVENOUS
  Filled 2019-03-14: qty 100

## 2019-03-14 NOTE — ED Notes (Signed)
Call from husband, updated on care.  Continue to await bed assignment

## 2019-03-14 NOTE — ED Notes (Signed)
[facility] arrived to transport pt to [facility]. Receiving floor RN to call back for report.

## 2019-03-14 NOTE — ED Notes (Signed)
Date and time results received: 03/14/19 0848   Test: lactic acid Critical Value: 2.2 Name of Provider Notified: Melina Copa Orders Received? Or Actions Taken?: no orders given

## 2019-03-14 NOTE — ED Notes (Signed)
Pt ambulatory to BR with steady gait.

## 2019-03-14 NOTE — ED Notes (Signed)
[facility] called and notified of ready bed.

## 2019-03-14 NOTE — ED Provider Notes (Signed)
[facility] Provider Note   CSN: [id number] Arrival date & time: 03/14/19  0749     History   Chief Complaint Chief Complaint  Patient presents with  . Abdominal Pain    HPI Megan Gibson is a 48 y.o. female.  She is complaining of 4 days of low abdominal pain which she is describes as pressure but episodes of sharpness that are becoming progressively more severe.  She had some nausea and one episode of vomiting.  She said she had her period 2 weeks ago and started vaginally bleeding again with this pain 4 days ago.  She said it was heavy.  She thought she saw some blood in her urine and had a televisit for which they put her on antibiotics for possible UTI.  No improvement in symptoms and no urinary symptoms.  No fever but has felt hot and cold.  She said she had a similar pain like this for years ago and she need to be admitted to the hospital for a week on antibiotics     The history is provided by the patient.  Abdominal Pain Pain location:  Suprapubic, LLQ and RLQ Pain quality: pressure and stabbing   Pain radiates to:  Does not radiate Pain severity:  Moderate Onset quality:  Gradual Duration:  4 days Timing:  Constant Progression:  Worsening Chronicity:  Recurrent Context: not recent illness, not sick contacts and not trauma   Relieved by:  Nothing Worsened by:  Movement Ineffective treatments:  NSAIDs (antibiotics) Associated symptoms: chills, fever, hematuria, nausea, vaginal bleeding and vomiting   Associated symptoms: no chest pain, no constipation, no cough, no diarrhea, no dysuria, no hematemesis, no hematochezia, no shortness of breath, no sore throat and no vaginal discharge     Past Medical History:  Diagnosis Date  . Anxiety   . Family history of breast cancer in mother   . Family history of breast cancer in mother   . Family history of colon cancer in mother   . Family history of ovarian cancer   . Family history of  pancreatic cancer   . History of abnormal cervical Pap smear   . Seasonal allergies     Patient Active Problem List   Diagnosis Date Noted  . Abdominal pain 01/14/2018  . Tachycardia   . Diverticulitis 01/12/2018  . Family history of ovarian cancer   . Family history of breast cancer in mother   . Family history of colon cancer in mother   . Family history of pancreatic cancer   . Anxiety 05/18/2014  . HPV test positive 05/18/2014  . Insomnia 05/18/2014  . Allergic rhinitis 05/18/2014  . FHx: breast cancer in first degree relative  mother age 3 05/18/2014  . FH: ovarian cancer in first degree relative  mother 05/18/2014  . FH: colon cancer in relative <15 years old  mother age 33's 05/18/2014    Past Surgical History:  Procedure Laterality Date  . ABLATION  11/2011  . CESAREAN SECTION    . CHOLECYSTECTOMY    . TUBAL LIGATION       OB History    Gravida  3   Para  2   Term  2   Preterm      AB  1   Living  2     SAB  1   TAB      Ectopic      Multiple      Live Births  2  Home Medications    Prior to Admission medications   Medication Sig Start Date End Date Taking? Authorizing Provider  acetaminophen (TYLENOL) 500 MG tablet Take 2 tablets (1,000 mg total) by mouth every 6 (six) hours as needed for mild pain, moderate pain or fever (or Fever >/= 101). 01/16/18   Eugenie Filler, MD  fexofenadine (ALLEGRA) 180 MG tablet Take 180 mg by mouth daily as needed for allergies.     [provider]  fluticasone (FLONASE) 50 MCG/ACT nasal spray One spray nostril daily for 2 weeks then stop   Give generic 10/05/14   Schoenhoff, Altamese Cabal, MD  pseudoephedrine (SUDAFED) 30 MG tablet Take 30 mg by mouth every 4 (four) hours as needed for congestion.    [provider]  saccharomyces boulardii (FLORASTOR) 250 MG capsule Take 1 capsule (250 mg total) by mouth 2 (two) times daily. 01/16/18   Eugenie Filler, MD  traMADol (ULTRAM) 50  MG tablet Take 1 tablet (50 mg total) by mouth every 6 (six) hours as needed for moderate pain. 01/16/18   Eugenie Filler, MD    Family History Family History  Problem Relation Age of Onset  . Breast cancer Mother        dx in her 55s  . Ovarian cancer Mother 68  . Colon cancer Mother 11  . Skin cancer Mother   . Heart disease Father   . Diabetes Father   . High blood pressure Father   . Heart attack Father   . Diabetes type II Father   . Diabetes Maternal Grandmother   . Heart disease Maternal Grandmother   . Pancreatic cancer Maternal Grandfather        dx and died in his 8s  . Ovarian cancer Maternal Aunt        dx in her 56s  . Lung cancer Maternal Uncle   . Multiple sclerosis Maternal Aunt   . Prostate cancer Maternal Uncle        metastized to liver  . Breast cancer Cousin        maternal first cousin dx in her 26s  . Cancer Cousin        maternal first cousin dx with cancer NOS, causing her to have a TAH-BSO in her 98s    Social History Social History   Tobacco Use  . Smoking status: Never Smoker  . Smokeless tobacco: Never Used  Substance Use Topics  . Alcohol use: Yes    Alcohol/week: 0.0 standard drinks    Comment: 5/month  . Drug use: No     Allergies   Patient has no known allergies.   Review of Systems Review of Systems  Constitutional: Positive for chills and fever.  HENT: Negative for sore throat.   Eyes: Negative for visual disturbance.  Respiratory: Negative for cough and shortness of breath.   Cardiovascular: Negative for chest pain.  Gastrointestinal: Positive for abdominal pain, nausea and vomiting. Negative for constipation, diarrhea, hematemesis and hematochezia.  Genitourinary: Positive for hematuria and vaginal bleeding. Negative for dysuria and vaginal discharge.  Musculoskeletal: Negative for neck pain.  Skin: Negative for rash.  Neurological: Negative for headaches.     Physical Exam Updated Vital Signs BP 133/89 (BP  Location: Right Arm)   Pulse (!) 119   Temp 98.7 F (37.1 C) (Oral)   Resp (!) 28   Ht 5\' 3"  (1.6 m)   Wt 77.1 kg   SpO2 100%   BMI 30.11 kg/m  Physical Exam Vitals signs and nursing note reviewed.  Constitutional:      General: She is not in acute distress.    Appearance: She is well-developed.  HENT:     Head: Normocephalic and atraumatic.  Eyes:     Conjunctiva/sclera: Conjunctivae normal.  Neck:     Musculoskeletal: Neck supple.  Cardiovascular:     Rate and Rhythm: Regular rhythm. Tachycardia present.     Heart sounds: No murmur.  Pulmonary:     Effort: Pulmonary effort is normal. No respiratory distress.     Breath sounds: Normal breath sounds.  Abdominal:     Palpations: Abdomen is soft.     Tenderness: There is abdominal tenderness in the right lower quadrant, suprapubic area and left lower quadrant. There is no guarding or rebound.  Musculoskeletal: Normal range of motion.     Right lower leg: No edema.     Left lower leg: No edema.  Skin:    General: Skin is warm and dry.     Capillary Refill: Capillary refill takes less than 2 seconds.  Neurological:     General: No focal deficit present.     Mental Status: She is alert.      ED Treatments / Results  Labs (all labs ordered are listed, but only abnormal results are displayed) Labs Reviewed  WET PREP, GENITAL - Abnormal; Notable for the following components:      Result Value   WBC, Wet Prep HPF POC FEW (*)    All other components within normal limits  URINALYSIS, ROUTINE W REFLEX MICROSCOPIC - Abnormal; Notable for the following components:   Color, Urine AMBER (*)    APPearance CLOUDY (*)    Hgb urine dipstick LARGE (*)    Bilirubin Urine SMALL (*)    Ketones, ur 15 (*)    Protein, ur 100 (*)    Leukocytes,Ua TRACE (*)    All other components within normal limits  COMPREHENSIVE METABOLIC PANEL - Abnormal; Notable for the following components:   CO2 19 (*)    Glucose, Bld 172 (*)    All other  components within normal limits  CBC WITH DIFFERENTIAL/PLATELET - Abnormal; Notable for the following components:   WBC 19.1 (*)    RBC 5.14 (*)    Neutro Abs 17.0 (*)    Abs Immature Granulocytes 0.09 (*)    All other components within normal limits  LACTIC ACID, PLASMA - Abnormal; Notable for the following components:   Lactic Acid, Venous 2.2 (*)    All other components within normal limits  URINALYSIS, MICROSCOPIC (REFLEX) - Abnormal; Notable for the following components:   Bacteria, UA MANY (*)    All other components within normal limits  SARS CORONAVIRUS 2 BY RT PCR (HOSPITAL ORDER, [place] [facility])  URINE CULTURE  CULTURE, BLOOD (ROUTINE X 2)  CULTURE, BLOOD (ROUTINE X 2)  PREGNANCY, URINE  LIPASE, BLOOD  LACTIC ACID, PLASMA  GC/CHLAMYDIA PROBE AMP ([place]) NOT AT [facility]    EKG None  Radiology Ct Abdomen Pelvis W Contrast  Result Date: 03/14/2019 CLINICAL DATA:  Lower abdominal pain EXAM: CT ABDOMEN AND PELVIS WITH CONTRAST TECHNIQUE: Multidetector CT imaging of the abdomen and pelvis was performed using the standard protocol following bolus administration of intravenous contrast. CONTRAST:  142mL OMNIPAQUE IOHEXOL 300 MG/ML  SOLN COMPARISON:  01/15/2018 FINDINGS: Lower chest: Lung bases are clear. Hepatobiliary: Focal fat/altered perfusion along the falciform ligament (series 2/image 21). Status post cholecystectomy. No intrahepatic  or extrahepatic ductal dilatation. Pancreas: Within normal limits. Spleen: Within normal limits. Adrenals/Urinary Tract: Adrenal glands are within normal limits. Kidneys are within normal limits.  No hydronephrosis. Bladder is within normal limits. Stomach/Bowel: Stomach is within normal limits. No evidence of bowel obstruction. Normal appendix (series 2/image 87). No colonic wall thickening or inflammatory changes. Vascular/Lymphatic: No evidence of abdominal aortic aneurysm. No suspicious abdominopelvic lymphadenopathy.  Reproductive: Uterus is within normal limits. Bilateral ovaries are grossly unremarkable on CT, with suspected hydrosalpinx bilaterally (series 2/image 65). Other: Small volume pelvic ascites with pelvic mesenteric stranding, similar to the prior, favoring PID. Musculoskeletal: Visualized osseous structures are within normal limits. IMPRESSION: Small volume pelvic ascites with pelvic mesenteric stranding, correlate for PID. Prior secondary inflammatory changes involving the sigmoid colon are not evident on the current study. Additional stable ancillary findings as above. Electronically Signed   By: Julian Hy M.D.   On: 03/14/2019 10:58    Procedures .Critical Care Performed by: Hayden Rasmussen, MD Authorized by: Hayden Rasmussen, MD   Critical care provider statement:    Critical care time (minutes):  45   Critical care was necessary to treat or prevent imminent or life-threatening deterioration of the following conditions:  Sepsis   Critical care was time spent personally by me on the following activities:  Discussions with consultants, evaluation of patient's response to treatment, examination of patient, ordering and performing treatments and interventions, ordering and review of laboratory studies, ordering and review of radiographic studies, pulse oximetry, re-evaluation of patient's condition, obtaining history from patient or surrogate, review of old charts and development of treatment plan with patient or surrogate   I assumed direction of critical care for this patient from another provider in my specialty: no     (including critical care time)  Medications Ordered in ED Medications  morphine 4 MG/ML injection 4 mg (4 mg Intravenous Given 03/14/19 1157)  ondansetron (ZOFRAN) injection 4 mg (4 mg Intravenous Given 03/14/19 1157)  sodium chloride 0.9 % bolus 1,000 mL (0 mLs Intravenous Stopped 03/14/19 0930)  morphine 4 MG/ML injection 4 mg (4 mg Intravenous Given 03/14/19  0832)  ondansetron (ZOFRAN) injection 4 mg (4 mg Intravenous Given 03/14/19 0830)  sodium chloride 0.9 % bolus 1,000 mL (0 mLs Intravenous Stopped 03/14/19 1034)  cefTRIAXone (ROCEPHIN) 2 g in sodium chloride 0.9 % 100 mL IVPB (0 g Intravenous Stopped 03/14/19 0941)  metroNIDAZOLE (FLAGYL) IVPB 500 mg (0 mg Intravenous Stopped 03/14/19 1147)  iohexol (OMNIPAQUE) 300 MG/ML solution 100 mL (100 mLs Intravenous Contrast Given 03/14/19 1018)     Initial Impression / Assessment and Plan / ED Course  I have reviewed the triage vital signs and the nursing notes.  Pertinent labs & imaging results that were available during my care of the patient were reviewed by me and considered in my medical decision making (see chart for details).  Clinical Course as of Mar 13 1636  Sun Mar 13, 4530  [id number] 48 year old female here with 4 days of low abdominal pain in the setting of vaginal bleeding.  Differential includes diverticulitis, appendicitis, UTI, TOA,   [MB]  [id number] Patient's white count elevated at 19 and her lactate is elevated at 2.2.  We will start her on some antibiotics.  She says she is actually feeling a little improved for the medicine.  She needs a pelvic and some imaging.   [MB]  [id number] Pelvic done with nurse Maggie as chaperone.  Normal external genitalia.  Internally a little bit of  dark blood.  She has some mild cervical motion tenderness with no obvious discharge.  She is tender in both adnexa and over the uterus.  No appreciable masses noted   [MB]  1139 Discussed with Dr. Kennon Rounds GYN at [facility].  She accepts the patient for admission on her service and agrees with current work-up.  She does not feel the patient needs an ultrasound with [place] currently.   [MB]    Clinical Course User Index [MB] Hayden Rasmussen, MD   Megan Gibson was evaluated in Emergency Department on 03/14/2019 for the symptoms described in the history of present illness. She was evaluated in the context of the global COVID-19  pandemic, which necessitated consideration that the patient might be at risk for infection with the SARS-CoV-2 virus that causes COVID-19. Institutional protocols and algorithms that pertain to the evaluation of patients at risk for COVID-19 are in a state of rapid change based on information released by regulatory bodies including the CDC and federal and state organizations. These policies and algorithms were followed during the patient's care in the ED.      Final Clinical Impressions(s) / ED Diagnoses   Final diagnoses:  SIRS (systemic inflammatory response syndrome) (HCC)  Lower abdominal pain  PID (acute pelvic inflammatory disease)    ED Discharge Orders    None       Hayden Rasmussen, MD 03/14/19 [phone]

## 2019-03-14 NOTE — ED Notes (Signed)
ED TO INPATIENT HANDOFF REPORT  ED Nurse Name and Phone #: Shelda Pal Wadsworth Name/Age/Gender Megan Gibson 48 y.o. female Room/Bed: MH06/MH06  Code Status   Code Status: Prior  Home/SNF/Other Home Patient oriented to: self, place, time and situation Is this baseline? Yes   Triage Complete: Triage complete  Chief Complaint Pressure/Pain in Abd  Triage Note Pt describes low abdominal/groin pain beginning Thursday, called in for televisit, was started on antibiotics, no improvement. Nausea, vomited x1 2 days ago, no active vomiting now.  Denies burning with urination.   Allergies No Known Allergies  Level of Care/Admitting Diagnosis ED Disposition    ED Disposition Condition Comment   Admit  Hospital Area: [facility] [[id number]]  Level of Care: Med-Surg [16]  Covid Evaluation: Confirmed COVID Negative  Diagnosis: PID (acute pelvic inflammatory disease) [id number]  Admitting Physician: Merrily Pew  Attending Physician: Donnamae Jude [[id number]]  Estimated length of stay: past midnight tomorrow  Certification:: I certify this patient will need inpatient services for at least 2 midnights  Bed request comments: 6 North  PT Class (Do Not Modify): Inpatient [101]  PT Acc Code (Do Not Modify): Private [1]       B Medical/Surgery History Past Medical History:  Diagnosis Date  . Anxiety   . Family history of breast cancer in mother   . Family history of breast cancer in mother   . Family history of colon cancer in mother   . Family history of ovarian cancer   . Family history of pancreatic cancer   . History of abnormal cervical Pap smear   . Seasonal allergies    Past Surgical History:  Procedure Laterality Date  . ABLATION  11/2011  . CESAREAN SECTION    . CHOLECYSTECTOMY    . TUBAL LIGATION       A IV Location/Drains/Wounds Patient Lines/Drains/Airways Status   Active Line/Drains/Airways    Name:   Placement date:   Placement time:    Site:   Days:   Peripheral IV 03/14/19 Left Antecubital   03/14/19    0819    Antecubital   less than 1          Intake/Output Last 24 hours  Intake/Output Summary (Last 24 hours) at 03/14/2019 1748 Last data filed at 03/14/2019 1456 Gross per 24 hour  Intake 92.6 ml  Output 300 ml  Net -207.4 ml    Labs/Imaging Results for orders placed or performed during the hospital encounter of 03/14/19 (from the past 48 hour(s))  Urinalysis, Routine w reflex microscopic     Status: Abnormal   Collection Time: 03/14/19  8:15 AM  Result Value Ref Range   Color, Urine AMBER (A) YELLOW    Comment: BIOCHEMICALS MAY BE AFFECTED BY COLOR   APPearance CLOUDY (A) CLEAR   Specific Gravity, Urine 1.025 1.005 - 1.030   pH 6.0 5.0 - 8.0   Glucose, UA NEGATIVE NEGATIVE mg/dL   Hgb urine dipstick LARGE (A) NEGATIVE   Bilirubin Urine SMALL (A) NEGATIVE   Ketones, ur 15 (A) NEGATIVE mg/dL   Protein, ur 100 (A) NEGATIVE mg/dL   Nitrite NEGATIVE NEGATIVE   Leukocytes,Ua TRACE (A) NEGATIVE    Comment: Performed at [facility], [address] [place], [place] [address]  Pregnancy, urine     Status: None   Collection Time: 03/14/19  8:15 AM  Result Value Ref Range   Preg Test, Ur NEGATIVE NEGATIVE    Comment:  THE SENSITIVITY OF THIS METHODOLOGY IS >20 mIU/mL. Performed at [facility], [address] [place], [place] [address]   Urinalysis, Microscopic (reflex)     Status: Abnormal   Collection Time: 03/14/19  8:15 AM  Result Value Ref Range   RBC / HPF >50 0 - 5 RBC/hpf   WBC, UA 0-5 0 - 5 WBC/hpf   Bacteria, UA MANY (A) NONE SEEN   Squamous Epithelial / LPF 6-10 0 - 5    Comment: Performed at [facility], [address] [place], [place] [address]  Culture, blood (routine x 2)     Status: None (Preliminary result)   Collection Time: 03/14/19  8:19 AM   Specimen: BLOOD LEFT ARM  Result Value Ref Range   Specimen Description      BLOOD LEFT  ARM Performed at [facility], [address] [place], [place] [address]    Special Requests      BOTTLES DRAWN AEROBIC AND ANAEROBIC Blood Culture adequate volume Performed at [facility], [address] [place], [place] [address]    Culture      NO GROWTH < 12 HOURS Performed at [facility], [place] [address], [place], [place] [address]    Report Status PENDING   Comprehensive metabolic panel     Status: Abnormal   Collection Time: 03/14/19  8:20 AM  Result Value Ref Range   Sodium 137 135 - 145 mmol/L   Potassium 3.7 3.5 - 5.1 mmol/L   Chloride 105 98 - 111 mmol/L   CO2 19 (L) 22 - 32 mmol/L   Glucose, Bld 172 (H) 70 - 99 mg/dL   BUN 10 6 - 20 mg/dL   Creatinine, Ser 0.77 0.44 - 1.00 mg/dL   Calcium 9.0 8.9 - 10.3 mg/dL   Total Protein 7.8 6.5 - 8.1 g/dL   Albumin 3.9 3.5 - 5.0 g/dL   AST 24 15 - 41 U/L   ALT 27 0 - 44 U/L   Alkaline Phosphatase 77 38 - 126 U/L   Total Bilirubin 0.8 0.3 - 1.2 mg/dL   GFR calc non Af Amer >60 >60 mL/min   GFR calc Af Amer >60 >60 mL/min   Anion gap 13 5 - 15    Comment: Performed at [facility], [address] [place], [place] [address]  Lipase, blood     Status: None   Collection Time: 03/14/19  8:20 AM  Result Value Ref Range   Lipase 23 11 - 51 U/L    Comment: Performed at [facility], [address] [place], [place] [address]  CBC with Differential     Status: Abnormal   Collection Time: 03/14/19  8:20 AM  Result Value Ref Range   WBC 19.1 (H) 4.0 - 10.5 K/uL   RBC 5.14 (H) 3.87 - 5.11 MIL/uL   Hemoglobin 14.1 12.0 - 15.0 g/dL   HCT 42.9 36.0 - 46.0 %   MCV 83.5 80.0 - 100.0 fL   MCH 27.4 26.0 - 34.0 pg   MCHC 32.9 30.0 - 36.0 g/dL   RDW 12.4 11.5 - 15.5 %   Platelets 247 150 - 400 K/uL   nRBC 0.0 0.0 - 0.2 %   Neutrophils Relative % 88 %   Neutro Abs 17.0 (H) 1.7 - 7.7 K/uL   Lymphocytes Relative 6 %   Lymphs Abs 1.1 0.7 - 4.0 K/uL  Monocytes Relative 5 %    Monocytes Absolute 0.9 0.1 - 1.0 K/uL   Eosinophils Relative 0 %   Eosinophils Absolute 0.0 0.0 - 0.5 K/uL   Basophils Relative 0 %   Basophils Absolute 0.0 0.0 - 0.1 K/uL   Immature Granulocytes 1 %   Abs Immature Granulocytes 0.09 (H) 0.00 - 0.07 K/uL    Comment: Performed at [facility], [address] [place], [place] [address]  Lactic acid, plasma     Status: Abnormal   Collection Time: 03/14/19  8:20 AM  Result Value Ref Range   Lactic Acid, Venous 2.2 (HH) 0.5 - 1.9 mmol/L    Comment: CRITICAL RESULT CALLED TO, READ BACK BY AND VERIFIED WITH: Laurena Slimmer RN [phone] PHILLIPS C Performed at [facility], [address] [place], [place] [address]   Culture, blood (routine x 2)     Status: None (Preliminary result)   Collection Time: 03/14/19  9:00 AM   Specimen: BLOOD LEFT FOREARM  Result Value Ref Range   Specimen Description      BLOOD LEFT FOREARM Performed at [facility], [address] [place], [place] [address]    Special Requests      BOTTLES DRAWN AEROBIC AND ANAEROBIC Blood Culture results may not be optimal due to an inadequate volume of blood received in culture bottles Performed at [facility], [address] [place], [place] [address]    Culture      NO GROWTH < 12 HOURS Performed at [facility], [place] [address], [place],  [address]    Report Status PENDING   Wet prep, genital     Status: Abnormal   Collection Time: 03/14/19  9:29 AM   Specimen: PATH Cytology Cervicovaginal Ancillary Only  Result Value Ref Range   Yeast Wet Prep HPF POC NONE SEEN NONE SEEN   Trich, Wet Prep NONE SEEN NONE SEEN   Clue Cells Wet Prep HPF POC NONE SEEN NONE SEEN   WBC, Wet Prep HPF POC FEW (A) NONE SEEN   Sperm NONE SEEN     Comment: Performed at [facility], [address] [place], [place] [address]  SARS Coronavirus 2 by RT PCR (hospital order, performed in [place] hospital lab) Nasopharyngeal  PATH Cytology Cervicovaginal Ancillary Only     Status: None   Collection Time: 03/14/19  9:29 AM   Specimen: PATH Cytology Cervicovaginal Ancillary Only; Nasopharyngeal  Result Value Ref Range   SARS Coronavirus 2 NEGATIVE NEGATIVE    Comment: (NOTE) If result is NEGATIVE SARS-CoV-2 target nucleic acids are NOT DETECTED. The SARS-CoV-2 RNA is generally detectable in upper and lower  respiratory specimens during the acute phase of infection. The lowest  concentration of SARS-CoV-2 viral copies this assay can detect is 250  copies / mL. A negative result does not preclude SARS-CoV-2 infection  and should not be used as the sole basis for treatment or other  patient management decisions.  A negative result may occur with  improper specimen collection / handling, submission of specimen other  than nasopharyngeal swab, presence of viral mutation(s) within the  areas targeted by this assay, and inadequate number of viral copies  (<250 copies / mL). A negative result must be combined with clinical  observations, patient history, and epidemiological information. If result is POSITIVE SARS-CoV-2 target nucleic acids are DETECTED. The SARS-CoV-2 RNA is generally detectable in upper  and lower  respiratory specimens dur ing the acute phase of infection.  Positive  results are indicative of active infection with SARS-CoV-2.  Clinical  correlation with patient history and other diagnostic information is  necessary to determine patient infection status.  Positive results do  not rule out bacterial infection or co-infection with other viruses. If result is PRESUMPTIVE POSTIVE SARS-CoV-2 nucleic acids MAY BE PRESENT.   A presumptive positive result was obtained on the submitted specimen  and confirmed on repeat testing.  While 2019 novel coronavirus  (SARS-CoV-2) nucleic acids may be present in the submitted sample  additional confirmatory testing may be necessary for epidemiological  and / or  clinical management purposes  to differentiate between  SARS-CoV-2 and other Sarbecovirus currently known to infect humans.  If clinically indicated additional testing with an alternate test  methodology [phone]) is advised. The SARS-CoV-2 RNA is generally  detectable in upper and lower respiratory sp ecimens during the acute  phase of infection. The expected result is Negative. Fact Sheet for Patients:  StrictlyIdeas.no Fact Sheet for Healthcare Providers: BankingDealers.co.za This test is not yet approved or cleared by the [place] FDA and has been authorized for detection and/or diagnosis of SARS-CoV-2 by FDA under an Emergency Use Authorization (EUA).  This EUA will remain in effect (meaning this test can be used) for the duration of the COVID-19 declaration under Section 564(b)(1) of the Act, 21 U.S.C. section 360bbb-3(b)(1), unless the authorization is terminated or revoked sooner. Performed at [facility], [address] [place], [place] [address]   Lactic acid, plasma     Status: None   Collection Time: 03/14/19 10:34 AM  Result Value Ref Range   Lactic Acid, Venous 1.6 0.5 - 1.9 mmol/L    Comment: Performed at [facility], [address] [place], [place] [address]   Ct Abdomen Pelvis W Contrast  Result Date: 03/14/2019 CLINICAL DATA:  Lower abdominal pain EXAM: CT ABDOMEN AND PELVIS WITH CONTRAST TECHNIQUE: Multidetector CT imaging of the abdomen and pelvis was performed using the standard protocol following bolus administration of intravenous contrast. CONTRAST:  169mL OMNIPAQUE IOHEXOL 300 MG/ML  SOLN COMPARISON:  01/15/2018 FINDINGS: Lower chest: Lung bases are clear. Hepatobiliary: Focal fat/altered perfusion along the falciform ligament (series 2/image 21). Status post cholecystectomy. No intrahepatic or extrahepatic ductal dilatation. Pancreas: Within normal limits. Spleen: Within normal limits.  Adrenals/Urinary Tract: Adrenal glands are within normal limits. Kidneys are within normal limits.  No hydronephrosis. Bladder is within normal limits. Stomach/Bowel: Stomach is within normal limits. No evidence of bowel obstruction. Normal appendix (series 2/image 87). No colonic wall thickening or inflammatory changes. Vascular/Lymphatic: No evidence of abdominal aortic aneurysm. No suspicious abdominopelvic lymphadenopathy. Reproductive: Uterus is within normal limits. Bilateral ovaries are grossly unremarkable on CT, with suspected hydrosalpinx bilaterally (series 2/image 65). Other: Small volume pelvic ascites with pelvic mesenteric stranding, similar to the prior, favoring PID. Musculoskeletal: Visualized osseous structures are within normal limits. IMPRESSION: Small volume pelvic ascites with pelvic mesenteric stranding, correlate for PID. Prior secondary inflammatory changes involving the sigmoid colon are not evident on the current study. Additional stable ancillary findings as above. Electronically Signed   By: Julian Hy M.D.   On: 03/14/2019 10:58    Pending Labs Unresulted Labs (From admission, onward)    Start     Ordered   03/14/19 0808  Urine culture  ONCE - STAT,   STAT    Question:  Patient immune status  Answer:  Normal   03/14/19 0808          Vitals/Pain Today's Vitals   03/14/19 1600 03/14/19 1630 03/14/19 1700 03/14/19 1740  BP: 131/77 127/75 123/85   Pulse: (!) 114 (!) 120 (!) 122   Resp: (!) 24 (!) 23 (!) 24   Temp:    (!) 102.1 F (38.9 C)  TempSrc:    Oral  SpO2: 98% 97% 96%   Weight:      Height:      PainSc:        Isolation Precautions No active isolations  Medications Medications  morphine 4 MG/ML injection 4 mg (4 mg Intravenous Given 03/14/19 1157)  ondansetron (ZOFRAN) injection 4 mg (4 mg Intravenous Given 03/14/19 1157)  sodium chloride 0.9 % bolus 1,000 mL (0 mLs Intravenous Stopped 03/14/19 0930)  morphine 4 MG/ML injection 4 mg (4 mg  Intravenous Given 03/14/19 0832)  ondansetron (ZOFRAN) injection 4 mg (4 mg Intravenous Given 03/14/19 0830)  sodium chloride 0.9 % bolus 1,000 mL (0 mLs Intravenous Stopped 03/14/19 1034)  cefTRIAXone (ROCEPHIN) 2 g in sodium chloride 0.9 % 100 mL IVPB (0 g Intravenous Stopped 03/14/19 0941)  metroNIDAZOLE (FLAGYL) IVPB 500 mg (0 mg Intravenous Stopped 03/14/19 1147)  iohexol (OMNIPAQUE) 300 MG/ML solution 100 mL (100 mLs Intravenous Contrast Given 03/14/19 1018)  ibuprofen (ADVIL) tablet 600 mg (600 mg Oral Given 03/14/19 1738)    Mobility walks Low fall risk   Focused Assessments Pt reports pelvic pain/lower abdominal pain- denies vaginal discharge or urinary symptoms.      R Recommendations: See Admitting Provider Note  Report given to:   Additional Notes:

## 2019-03-14 NOTE — ED Notes (Signed)
Patient transported to CT 

## 2019-03-14 NOTE — ED Notes (Signed)
Assisted to bedside commode.  Denies chest pain

## 2019-03-14 NOTE — ED Triage Notes (Signed)
Pt describes low abdominal/groin pain beginning Thursday, called in for televisit, was started on antibiotics, no improvement. Nausea, vomited x1 2 days ago, no active vomiting now.  Denies burning with urination.

## 2019-03-14 NOTE — H&P (Signed)
Megan Gibson is an 48 y.o. [phone] female.   Chief Complaint: abdominal pain HPI: Pain started 4 days ago. Is crampy and feels like pelvic pressure. She is unable to get comfortable. Has tried tylenol and ibuprofen, which helped some. Presented to [facility] today and was found to be febrile, have elevated WBC, one episode of N/V and needed admission. She is tachycardic. She has had previous admission with similar which improved in 12/2017. Did have colonoscopy 11/2017.  Past Medical History:  Diagnosis Date  . Anxiety   . Family history of breast cancer in mother   . Family history of breast cancer in mother   . Family history of colon cancer in mother   . Family history of ovarian cancer   . Family history of pancreatic cancer   . History of abnormal cervical Pap smear   . Seasonal allergies     Past Surgical History:  Procedure Laterality Date  . ABLATION  11/2011  . CESAREAN SECTION    . CHOLECYSTECTOMY    . TUBAL LIGATION      Family History  Problem Relation Age of Onset  . Breast cancer Mother        dx in her 50s  . Ovarian cancer Mother 15  . Colon cancer Mother 56  . Skin cancer Mother   . Heart disease Father   . Diabetes Father   . High blood pressure Father   . Heart attack Father   . Diabetes type II Father   . Diabetes Maternal Grandmother   . Heart disease Maternal Grandmother   . Pancreatic cancer Maternal Grandfather        dx and died in his 23s  . Ovarian cancer Maternal Aunt        dx in her 21s  . Lung cancer Maternal Uncle   . Multiple sclerosis Maternal Aunt   . Prostate cancer Maternal Uncle        metastized to liver  . Breast cancer Cousin        maternal first cousin dx in her 73s  . Cancer Cousin        maternal first cousin dx with cancer NOS, causing her to have a TAH-BSO in her 38s   Social History:  reports that she has never smoked. She has never used smokeless tobacco. She reports current alcohol use. She reports that she does not use  drugs.  Allergies: No Known Allergies  Medications Prior to Admission  Medication Sig Dispense Refill  . acetaminophen (TYLENOL) 500 MG tablet Take 2 tablets (1,000 mg total) by mouth every 6 (six) hours as needed for mild pain, moderate pain or fever (or Fever >/= 101). 30 tablet 0  . fexofenadine (ALLEGRA) 180 MG tablet Take 180 mg by mouth daily as needed for allergies.     . fluticasone (FLONASE) 50 MCG/ACT nasal spray One spray nostril daily for 2 weeks then stop   Give generic 16 g 1  . pseudoephedrine (SUDAFED) 30 MG tablet Take 30 mg by mouth every 4 (four) hours as needed for congestion.    . saccharomyces boulardii (FLORASTOR) 250 MG capsule Take 1 capsule (250 mg total) by mouth 2 (two) times daily.    . traMADol (ULTRAM) 50 MG tablet Take 1 tablet (50 mg total) by mouth every 6 (six) hours as needed for moderate pain. 15 tablet 0    Pertinent items are noted in HPI.  Blood pressure 123/78, pulse (!) 105, temperature 100.2 F (37.9 C),  temperature source Oral, resp. rate 20, height 5\' 3"  (1.6 m), weight 81.1 kg, SpO2 98 %. General appearance: alert, cooperative, appears stated age, mild distress and mildly obese Head: Normocephalic, without obvious abnormality, atraumatic Neck: supple, symmetrical, trachea midline Lungs: normal effort Heart: regular rate and rhythm Abdomen: soft, diffusely tender L>R, no rebound Extremities: Homans sign is negative, no sign of DVT Skin: Skin color, texture, turgor normal. No rashes or lesions Neurologic: Grossly normal   Lab Results  Component Value Date   WBC 19.1 (H) 03/14/2019   HGB 14.1 03/14/2019   HCT 42.9 03/14/2019   MCV 83.5 03/14/2019   PLT 247 03/14/2019   Lab Results  Component Value Date   PREGTESTUR NEGATIVE 03/14/2019     Assessment/Plan Principal Problem:   PID (acute pelvic inflammatory disease)  For IV hydration IV Abx--cefotan and doxy TVUS tomorrow Pain control Advised will likely need pelvic clean out  eventually. Has GYN MD in [place]  Risks include but are not limited to bleeding, infection, injury to surrounding structures, including bowel, bladder and ureters, blood clots, and death.  Likelihood of success is high.    Donnamae Jude 03/14/2019, 8:10 PM

## 2019-03-15 ENCOUNTER — Inpatient Hospital Stay (HOSPITAL_COMMUNITY): Payer: BLUE CROSS/BLUE SHIELD

## 2019-03-15 DIAGNOSIS — N7093 Salpingitis and oophoritis, unspecified: Secondary | ICD-10-CM | POA: Diagnosis present

## 2019-03-15 DIAGNOSIS — R6510 Systemic inflammatory response syndrome (SIRS) of non-infectious origin without acute organ dysfunction: Secondary | ICD-10-CM

## 2019-03-15 DIAGNOSIS — R1030 Lower abdominal pain, unspecified: Secondary | ICD-10-CM

## 2019-03-15 LAB — CBC WITH DIFFERENTIAL/PLATELET
Abs Immature Granulocytes: 0.07 10*3/uL (ref 0.00–0.07)
Basophils Absolute: 0 10*3/uL (ref 0.0–0.1)
Basophils Relative: 0 %
Eosinophils Absolute: 0.1 10*3/uL (ref 0.0–0.5)
Eosinophils Relative: 1 %
HCT: 39.9 % (ref 36.0–46.0)
Hemoglobin: 13.8 g/dL (ref 12.0–15.0)
Immature Granulocytes: 1 %
Lymphocytes Relative: 11 %
Lymphs Abs: 1.7 10*3/uL (ref 0.7–4.0)
MCH: 28.9 pg (ref 26.0–34.0)
MCHC: 34.6 g/dL (ref 30.0–36.0)
MCV: 83.5 fL (ref 80.0–100.0)
Monocytes Absolute: 1.5 10*3/uL — ABNORMAL HIGH (ref 0.1–1.0)
Monocytes Relative: 10 %
Neutro Abs: 11.8 10*3/uL — ABNORMAL HIGH (ref 1.7–7.7)
Neutrophils Relative %: 77 %
Platelets: 225 10*3/uL (ref 150–400)
RBC: 4.78 MIL/uL (ref 3.87–5.11)
RDW: 12.4 % (ref 11.5–15.5)
WBC: 15.1 10*3/uL — ABNORMAL HIGH (ref 4.0–10.5)
nRBC: 0 % (ref 0.0–0.2)

## 2019-03-15 MED ORDER — METRONIDAZOLE IN NACL 5-0.79 MG/ML-% IV SOLN
500.0000 mg | Freq: Three times a day (TID) | INTRAVENOUS | Status: DC
  Administered 2019-03-15 – 2019-03-16 (×3): 500 mg via INTRAVENOUS
  Filled 2019-03-15 (×3): qty 100

## 2019-03-15 MED ORDER — POLYETHYLENE GLYCOL 3350 17 G PO PACK: 17.0000 g | PACK | Freq: Every day | ORAL | Status: DC | PRN

## 2019-03-15 MED ORDER — DOCUSATE SODIUM 100 MG PO CAPS
100.0000 mg | ORAL_CAPSULE | Freq: Two times a day (BID) | ORAL | Status: DC | PRN
  Administered 2019-03-16: 100 mg via ORAL
  Filled 2019-03-15: qty 1

## 2019-03-15 NOTE — Progress Notes (Signed)
[facility]N Attending Note  Subjective:  Denies any pain, N/V.  Report constipation, desires medication. Ambulating. Had ultrasound earlier today. No other symptoms.  Admitted on 03/14/2019 for TOA (tubo-ovarian abscess).    Objective:  Blood pressure 114/79, pulse 81, temperature 98.6 F (37 C), temperature source Oral, resp. rate 17, height 5\' 3"  (1.6 m), weight 82.5 kg, SpO2 99 %.  Patient Vitals for the past 24 hrs:  BP Temp Temp src Pulse Resp SpO2 Weight  03/15/19 0818 114/79 - - 81 17 99 % -  03/15/19 0507 116/79 98.6 F (37 C) Oral 87 16 98 % -  03/15/19 0500 - - - - - - 82.5 kg  03/15/19 0020 104/67 98.4 F (36.9 C) Oral 87 17 98 % -  03/14/19 2318 (!) 98/56 98.2 F (36.8 C) Oral 93 20 97 % -  03/14/19 2229 (!) 97/55 98.4 F (36.9 C) Oral 89 18 99 % -  03/14/19 2149 111/69 98.4 F (36.9 C) Oral (!) 103 20 98 % -  03/14/19 2111 112/69 98.5 F (36.9 C) Oral (!) 102 20 97 % -  03/14/19 2049 104/65 98.5 F (36.9 C) Oral (!) 104 18 98 % -  03/14/19 2027 115/67 98.4 F (36.9 C) Oral (!) 101 20 99 % -  03/14/19 1957 123/78 100.2 F (37.9 C) Oral (!) 105 20 98 % -  03/14/19 1900 (!) 115/96 (!) 101.3 F (38.5 C) Oral (!) 114 (!) 26 98 % -  03/14/19 1816 - (!) 101.9 F (38.8 C) Oral - - - -  03/14/19 1740 - (!) 102.1 F (38.9 C) Oral - - - -  03/14/19 1700 123/85 - - (!) 122 (!) 24 96 % -  03/14/19 1630 127/75 - - (!) 120 (!) 23 97 % -  03/14/19 1600 131/77 - - (!) 114 (!) 24 98 % -  03/14/19 1530 128/83 - - (!) 118 (!) 23 97 % -  03/14/19 1500 128/85 - - (!) 122 (!) 24 98 % -  03/14/19 1400 118/77 - - (!) 116 (!) 22 99 % -  03/14/19 1300 114/82 - - (!) 117 20 100 % -  03/14/19 1244 - - - (!) 119 20 100 % -  03/14/19 1155 128/87 - - (!) 115 18 100 % -   Gen: NAD HENT: Normocephalic, atraumatic Lungs: Normal respiratory effort Heart: Regular rate noted Abdomen: Moderate LLQ tenderness, mild generalized tenderness, no rebound or guarding Cervix:  Deferred Ext: 2+ DTRs, no edema, no cyanosis, negative Homan's sign  Labs: CBC Latest Ref Rng & Units 03/15/2019 03/14/2019 03/14/2019  WBC 4.0 - 10.5 K/uL 15.1(H) 14.9(H) 19.1(H)  Hemoglobin 12.0 - 15.0 g/dL 13.8 12.2 14.1  Hematocrit 36.0 - 46.0 % 39.9 36.7 42.9  Platelets 150 - 400 K/uL 225 208 247   CMP Latest Ref Rng & Units 03/14/2019 03/14/2019 01/16/2018  Glucose 70 - 99 mg/dL - 172(H) 102(H)  BUN 6 - 20 mg/dL - 10 <5(L)  Creatinine 0.44 - 1.00 mg/dL 0.70 0.77 0.62  Sodium 135 - 145 mmol/L - 137 140  Potassium 3.5 - 5.1 mmol/L - 3.7 3.6  Chloride 98 - 111 mmol/L - 105 104  CO2 22 - 32 mmol/L - 19(L) 28  Calcium 8.9 - 10.3 mg/dL - 9.0 8.6(L)  Total Protein 6.5 - 8.1 g/dL - 7.8 -  Total Bilirubin 0.3 - 1.2 mg/dL - 0.8 -  Alkaline Phos 38 - 126 U/L - 77 -  AST 15 -  41 U/L - 24 -  ALT 0 - 44 U/L - 27 -    Imaging: Ct Abdomen Pelvis W Contrast  Result Date: 03/14/2019 CLINICAL DATA:  Lower abdominal pain EXAM: CT ABDOMEN AND PELVIS WITH CONTRAST TECHNIQUE: Multidetector CT imaging of the abdomen and pelvis was performed using the standard protocol following bolus administration of intravenous contrast. CONTRAST:  168mL OMNIPAQUE IOHEXOL 300 MG/ML  SOLN COMPARISON:  01/15/2018 FINDINGS: Lower chest: Lung bases are clear. Hepatobiliary: Focal fat/altered perfusion along the falciform ligament (series 2/image 21). Status post cholecystectomy. No intrahepatic or extrahepatic ductal dilatation. Pancreas: Within normal limits. Spleen: Within normal limits. Adrenals/Urinary Tract: Adrenal glands are within normal limits. Kidneys are within normal limits.  No hydronephrosis. Bladder is within normal limits. Stomach/Bowel: Stomach is within normal limits. No evidence of bowel obstruction. Normal appendix (series 2/image 87). No colonic wall thickening or inflammatory changes. Vascular/Lymphatic: No evidence of abdominal aortic aneurysm. No suspicious abdominopelvic lymphadenopathy.  Reproductive: Uterus is within normal limits. Bilateral ovaries are grossly unremarkable on CT, with suspected hydrosalpinx bilaterally (series 2/image 65). Other: Small volume pelvic ascites with pelvic mesenteric stranding, similar to the prior, favoring PID. Musculoskeletal: Visualized osseous structures are within normal limits. IMPRESSION: Small volume pelvic ascites with pelvic mesenteric stranding, correlate for PID. Prior secondary inflammatory changes involving the sigmoid colon are not evident on the current study. Additional stable ancillary findings as above. Electronically Signed   By: Julian Hy M.D.   On: 03/14/2019 10:58   US Pelvic Complete With Transvaginal  Result Date: 03/15/2019 CLINICAL DATA:  Pelvic inflammatory disease on recent CT EXAM: TRANSABDOMINAL AND TRANSVAGINAL ULTRASOUND OF PELVIS TECHNIQUE: Both transabdominal and transvaginal ultrasound examinations of the pelvis were performed. Transabdominal technique was performed for global imaging of the pelvis including uterus, ovaries, adnexal regions, and pelvic cul-de-sac. It was necessary to proceed with endovaginal exam following the transabdominal exam to visualize the uterus, endometrium, ovaries and adnexa. COMPARISON:  CT 03/14/2019 FINDINGS: Uterus Measurements: 8.9 x 5.2 x 6.1 cm = volume: 148 mL. No fibroids or other mass visualized. Endometrium Thickness: 10 mm in thickness.  No focal abnormality visualized. Right ovary Measurements: 3.0 x 1.7 x 1.7 cm = volume: 4.5 mL. 2.8 x 1.5 x 0.8 cm complex cystic area noted in the right adnexa. Left ovary Measurements: 3.5 x 2.1 x 2.7 cm = volume: 10.2 mL. Multiple complex cystic areas noted in the left adnexa including 4.5 x 2.9 x 2.2 cm complex area lateral to the left ovary and 3.7 x 4.0 x 2.8 cm complex area medial to the left ovary. Other findings No abnormal free fluid. IMPRESSION: Multiple complex fluid collections in the pelvis, left adnexa greater than right. These are  in keeping and similar to prior CT study and most compatible with pelvic inflammatory disease. Electronically Signed   By: Rolm Baptise M.D.   On: 03/15/2019 10:03    Assessment & Plan:  48 y.o. [id number] admitted for PID/TOA. - Flagyl added to regimen given evidence given likely TOA seen on pelvic ultrasound.  - Stable WBC, pain improved. No fever since 1900 last night. Continue IV antibiotics for now - Analgesia as needed - Colace and Miralax ordered for constipation - Encourage OOB. Continue close observation.  Verita Schneiders, MD, [place] for [employer] [facility], [place]

## 2019-03-15 NOTE — Plan of Care (Signed)
  Problem: Pain Managment: Goal: General experience of comfort will improve Outcome: Progressing   Problem: Safety: Goal: Ability to remain free from injury will improve Outcome: Progressing   Problem: Skin Integrity: Goal: Risk for impaired skin integrity will decrease Outcome: Progressing   

## 2019-03-15 NOTE — Progress Notes (Signed)
I arrived on 6N to round on patient, she was absent from her room.  I was informed she was off the floor getting an ultrasound.  I will return later to round on patient. Continue routine care until then.  Verita Schneiders, MD

## 2019-03-16 LAB — GC/CHLAMYDIA PROBE AMP (~~LOC~~) NOT AT ARMC
Chlamydia: NEGATIVE
Neisseria Gonorrhea: NEGATIVE

## 2019-03-16 MED ORDER — METRONIDAZOLE 500 MG PO TABS: 500.0000 mg | ORAL_TABLET | Freq: Three times a day (TID) | ORAL | 0 refills | Status: AC

## 2019-03-16 MED ORDER — DOXYCYCLINE HYCLATE 100 MG PO TABS: 100.0000 mg | ORAL_TABLET | Freq: Two times a day (BID) | ORAL | Status: DC

## 2019-03-16 MED ORDER — DOXYCYCLINE HYCLATE 100 MG PO TABS: 100.0000 mg | ORAL_TABLET | Freq: Two times a day (BID) | ORAL | 0 refills | Status: AC

## 2019-03-16 MED ORDER — IBUPROFEN 800 MG PO TABS: 800.0000 mg | ORAL_TABLET | Freq: Three times a day (TID) | ORAL | 2 refills | Status: DC | PRN

## 2019-03-16 MED ORDER — SENNOSIDES-DOCUSATE SODIUM 8.6-50 MG PO TABS: 1.0000 | ORAL_TABLET | Freq: Two times a day (BID) | ORAL | 2 refills | Status: DC | PRN

## 2019-03-16 MED ORDER — METRONIDAZOLE 500 MG PO TABS
500.0000 mg | ORAL_TABLET | Freq: Three times a day (TID) | ORAL | Status: DC
  Administered 2019-03-16: 500 mg via ORAL
  Filled 2019-03-16: qty 1

## 2019-03-16 NOTE — Discharge Summary (Signed)
Physician Discharge Summary  Patient ID: Megan Gibson MRN: [id number] DOB/AGE: 1970/09/02 48 y.o.  Admit date: 03/14/2019 Discharge date: 03/16/2019  Admission and Discharge Diagnoses:  Principal Problem:   TOA (tubo-ovarian abscess) Active Problems:   PID (acute pelvic inflammatory disease)  Discharged Condition: stable  Hospital Course: Patient was admitted with fevers and lower abdominal/pelvic pain.  Imaging consistent with PID/TOA.  Had negative wet prep, other pelvic cultures still pending at the time of discharge. She was treated with IV antibiotics that was transitioned to oral antibiotics on Day 3.  She was afebrile for over 36 hours, had minimal pain and desired to go home.  She was sent home on oral antibiotics to complete a 14 day course. She will follow up with her GYN provider, considering definitive surgical management with TAH/BSO.   Consults: None  Significant Diagnostic Studies:  CBC Latest Ref Rng & Units 03/15/2019 03/14/2019 03/14/2019  WBC 4.0 - 10.5 K/uL 15.1(H) 14.9(H) 19.1(H)  Hemoglobin 12.0 - 15.0 g/dL 13.8 12.2 14.1  Hematocrit 36.0 - 46.0 % 39.9 36.7 42.9  Platelets 150 - 400 K/uL 225 208 247   BMP Latest Ref Rng & Units 03/14/2019 03/14/2019 01/16/2018  Glucose 70 - 99 mg/dL - 172(H) 102(H)  BUN 6 - 20 mg/dL - 10 <5(L)  Creatinine 0.44 - 1.00 mg/dL 0.70 0.77 0.62  BUN/Creat Ratio 6 - 22 (calc) - - -  Sodium 135 - 145 mmol/L - 137 140  Potassium 3.5 - 5.1 mmol/L - 3.7 3.6  Chloride 98 - 111 mmol/L - 105 104  CO2 22 - 32 mmol/L - 19(L) 28  Calcium 8.9 - 10.3 mg/dL - 9.0 8.6(L)   Ct Abdomen Pelvis W Contrast  Result Date: 03/14/2019 CLINICAL DATA:  Lower abdominal pain EXAM: CT ABDOMEN AND PELVIS WITH CONTRAST TECHNIQUE: Multidetector CT imaging of the abdomen and pelvis was performed using the standard protocol following bolus administration of intravenous contrast. CONTRAST:  126mL OMNIPAQUE IOHEXOL 300 MG/ML  SOLN COMPARISON:  01/15/2018 FINDINGS:  Lower chest: Lung bases are clear. Hepatobiliary: Focal fat/altered perfusion along the falciform ligament (series 2/image 21). Status post cholecystectomy. No intrahepatic or extrahepatic ductal dilatation. Pancreas: Within normal limits. Spleen: Within normal limits. Adrenals/Urinary Tract: Adrenal glands are within normal limits. Kidneys are within normal limits.  No hydronephrosis. Bladder is within normal limits. Stomach/Bowel: Stomach is within normal limits. No evidence of bowel obstruction. Normal appendix (series 2/image 87). No colonic wall thickening or inflammatory changes. Vascular/Lymphatic: No evidence of abdominal aortic aneurysm. No suspicious abdominopelvic lymphadenopathy. Reproductive: Uterus is within normal limits. Bilateral ovaries are grossly unremarkable on CT, with suspected hydrosalpinx bilaterally (series 2/image 65). Other: Small volume pelvic ascites with pelvic mesenteric stranding, similar to the prior, favoring PID. Musculoskeletal: Visualized osseous structures are within normal limits. IMPRESSION: Small volume pelvic ascites with pelvic mesenteric stranding, correlate for PID. Prior secondary inflammatory changes involving the sigmoid colon are not evident on the current study. Additional stable ancillary findings as above. Electronically Signed   By: Julian Hy M.D.   On: 03/14/2019 10:58   US Pelvic Complete With Transvaginal  Result Date: 03/15/2019 CLINICAL DATA:  Pelvic inflammatory disease on recent CT EXAM: TRANSABDOMINAL AND TRANSVAGINAL ULTRASOUND OF PELVIS TECHNIQUE: Both transabdominal and transvaginal ultrasound examinations of the pelvis were performed. Transabdominal technique was performed for global imaging of the pelvis including uterus, ovaries, adnexal regions, and pelvic cul-de-sac. It was necessary to proceed with endovaginal exam following the transabdominal exam to visualize the uterus, endometrium, ovaries and adnexa.  COMPARISON:  CT 03/14/2019  FINDINGS: Uterus Measurements: 8.9 x 5.2 x 6.1 cm = volume: 148 mL. No fibroids or other mass visualized. Endometrium Thickness: 10 mm in thickness.  No focal abnormality visualized. Right ovary Measurements: 3.0 x 1.7 x 1.7 cm = volume: 4.5 mL. 2.8 x 1.5 x 0.8 cm complex cystic area noted in the right adnexa. Left ovary Measurements: 3.5 x 2.1 x 2.7 cm = volume: 10.2 mL. Multiple complex cystic areas noted in the left adnexa including 4.5 x 2.9 x 2.2 cm complex area lateral to the left ovary and 3.7 x 4.0 x 2.8 cm complex area medial to the left ovary. Other findings No abnormal free fluid. IMPRESSION: Multiple complex fluid collections in the pelvis, left adnexa greater than right. These are in keeping and similar to prior CT study and most compatible with pelvic inflammatory disease. Electronically Signed   By: Rolm Baptise M.D.   On: 03/15/2019 10:03   Treatments: Antibiotics: Cefotetan, Metronidazole and Doxycycline.  Discharge Exam: Blood pressure 114/75, pulse 79, temperature 97.8 F (36.6 C), temperature source Oral, resp. rate 18, height 5\' 3"  (1.6 m), weight 84.6 kg, SpO2 97 %. Gen: NAD HENT: Normocephalic, atraumatic Lungs: Normal respiratory effort Heart: Regular rate noted Abdomen: Mild LLQ tenderness, mild generalized tenderness, no rebound or guarding Cervix: Deferred Ext: 2+ DTRs, no edema, no cyanosis, negative Homan's sign  Disposition: Discharge disposition: 01-Home or Self Care        Allergies as of 03/16/2019   No Known Allergies     Medication List    STOP taking these medications   nitrofurantoin 100 MG capsule Commonly known as: MACRODANTIN     TAKE these medications   acetaminophen 500 MG tablet Commonly known as: TYLENOL Take 2 tablets (1,000 mg total) by mouth every 6 (six) hours as needed for mild pain, moderate pain or fever (or Fever >/= 101). What changed: Another medication with the same name was removed. Continue taking this medication, and  follow the directions you see here.   doxycycline 100 MG tablet Commonly known as: VIBRA-TABS Take 1 tablet (100 mg total) by mouth every 12 (twelve) hours for 14 days.   fexofenadine 180 MG tablet Commonly known as: ALLEGRA Take 180 mg by mouth at bedtime.   fluticasone 50 MCG/ACT nasal spray Commonly known as: FLONASE One spray nostril daily for 2 weeks then stop   Give generic What changed:   how much to take  how to take this  when to take this  reasons to take this  additional instructions   ibuprofen 800 MG tablet Commonly known as: ADVIL Take 1 tablet (800 mg total) by mouth 3 (three) times daily with meals as needed for headache, mild pain, moderate pain or cramping. What changed:   medication strength  how much to take  when to take this  reasons to take this   METAMUCIL PO Take 1 Scoop by mouth daily as needed (constipation).   metroNIDAZOLE 500 MG tablet Commonly known as: FLAGYL Take 1 tablet (500 mg total) by mouth 3 (three) times daily for 14 days.   pseudoephedrine 30 MG tablet Commonly known as: SUDAFED Take 30 mg by mouth every 4 (four) hours as needed for congestion.   senna-docusate 8.6-50 MG tablet Commonly known as: Senokot-S Take 1 tablet by mouth 2 (two) times daily as needed for mild constipation or moderate constipation.      Follow-up Information    GYN Provider. Schedule an appointment as soon as  possible for a visit in 2 week(s).   Why: Follow up          Signed: Verita Schneiders, MD 03/16/2019, 10:36 AM

## 2019-03-16 NOTE — Progress Notes (Signed)
AVS given and reviewed with pt. Medications discussed. All questions answered to satisfaction. Pt verbalized understanding of information given. Pt escorted off the unit via wheelchair by staff member.  

## 2019-03-16 NOTE — Discharge Instructions (Signed)
Pelvic Inflammatory Disease  Pelvic inflammatory disease (PID) is caused by an infection in some or all of the female reproductive organs. The infection can be in the uterus, ovaries, fallopian tubes, or the surrounding tissues in the pelvis. PID can cause abdominal or pelvic pain that comes on suddenly (acute pelvic pain). PID is a serious infection because it can lead to lasting (chronic) pelvic pain or the inability to have children (infertility). What are the causes? This condition is most often caused by bacteria that is spread during sexual contact. It can also be caused by a bacterial infection of the vagina (bacterial vaginosis) that is not spread by sexual contact. This condition occurs when the infection is not treated and the bacteria travel upward from the vagina or cervix into the reproductive organs. Bacteria may also be introduced into the reproductive organs following:  The birth of a baby.  A miscarriage.  An abortion.  Major pelvic surgery.  The insertion of an intrauterine device (IUD).  A sexual assault. What increases the risk? You are more likely to develop this condition if you:  Are younger than 46 years of age.  Are sexually active at a young age.  Have a history of STI (sexually transmitted infection) or PID.  Do not regularly use barrier contraception methods, such as condoms.  Have multiple sexual partners.  Have sex with someone who has symptoms of an STI.  Use a vaginal douche.  Have recently had an IUD inserted. What are the signs or symptoms? Symptoms of this condition include:  Abdominal or pelvic pain.  Fever.  Chills.  Abnormal vaginal discharge.  Abnormal uterine bleeding.  Unusual pain shortly after the end of a menstrual period.  Painful urination.  Pain with sex.  Nausea and vomiting. How is this diagnosed? This condition is diagnosed based on a pelvic exam and medical history. A pelvic exam can reveal signs of  infection, inflammation, and discharge in the vagina and the surrounding tissues. It can also help to identify painful areas. You may also have tests, such as:  Lab tests, including a pregnancy test, blood tests, and a urine test.  Culture tests of the vagina and cervix to check for an STI.  Ultrasound.  A laparoscopic procedure to look inside the pelvis.  Examination of vaginal discharge under a microscope. How is this treated? This condition may be treated with:  Antibiotic medicines taken by mouth (orally). For more severe cases, antibiotics may be given through an IV at the hospital.  Surgery. This is rare. Surgery may be needed if other treatments do not help.  Efforts to stop the spread of the infection. Sexual partners may need to be treated if the infection is caused by an STI. It may take weeks until you are completely well. If you are diagnosed with PID, you should also be checked for HIV (human immunodeficiency virus). Your health care provider may test you for infection again 3 months after treatment. You should not have unprotected sex. Follow these instructions at home:  Take over-the-counter and prescription medicines only as told by your health care provider.  If you were prescribed an antibiotic medicine, take it as told by your health care provider. Do not stop using the antibiotic even if you start to feel better.  Do not have sex until treatment is completed or as told by your health care provider. If PID is confirmed, your recent sexual partners will need treatment, especially if you had unprotected sex.  Keep all  follow-up visits as told by your health care provider. This is important. Contact a health care provider if:  You have increased or abnormal vaginal discharge.  Your pain does not improve.  You vomit.  You have a fever.  You cannot tolerate your medicines.  Your partner has an STI.  You have pain when you urinate. Get help right away  if:  You have increased abdominal or pelvic pain.  You have chills.  Your symptoms are not better in 72 hours with treatment. Summary  Pelvic inflammatory disease (PID) is caused by an infection in some or all of the female reproductive organs.  PID is a serious infection because it can lead to lasting (chronic) pelvic pain or the inability to have children (infertility).  This infection is usually treated with antibiotic medicines.  Do not have sex until treatment is completed or as told by your health care provider. This information is not intended to replace advice given to you by your health care provider. Make sure you discuss any questions you have with your health care provider. Document Released: 05/13/2005 Document Revised: 01/29/2018 Document Reviewed: 02/03/2018 Elsevier Patient Education  2020 [employer].

## 2019-03-16 NOTE — Plan of Care (Signed)

## 2019-03-17 LAB — URINE CULTURE: Culture: 60000 — AB

## 2019-03-19 LAB — CULTURE, BLOOD (ROUTINE X 2)
Culture: NO GROWTH
Culture: NO GROWTH
Special Requests: ADEQUATE

## 2019-03-30 ENCOUNTER — Telehealth: Payer: Self-pay | Admitting: *Deleted

## 2019-03-30 NOTE — Telephone Encounter (Signed)
Called and left the patient a message to call the office back. Need to schedule the patient for a new patient appt  

## 2019-03-31 ENCOUNTER — Telehealth: Payer: Self-pay | Admitting: *Deleted

## 2019-03-31 NOTE — Telephone Encounter (Signed)
Called and scheduled the patient for a new patient appt on 11/24. Gave the address and phone number. Also gave the policy for no visitors, mask and parking

## 2019-04-20 ENCOUNTER — Encounter: Payer: Self-pay | Admitting: Gynecologic Oncology

## 2019-04-20 ENCOUNTER — Inpatient Hospital Stay: Payer: BLUE CROSS/BLUE SHIELD | Attending: Gynecologic Oncology | Admitting: Gynecologic Oncology

## 2019-04-20 ENCOUNTER — Other Ambulatory Visit: Payer: Self-pay | Admitting: Gynecologic Oncology

## 2019-04-20 ENCOUNTER — Other Ambulatory Visit: Payer: Self-pay

## 2019-04-20 VITALS — BP 135/83 | HR 100 | Temp 98.2°F | Resp 20 | Ht 63.0 in | Wt 175.7 lb

## 2019-04-20 DIAGNOSIS — N739 Female pelvic inflammatory disease, unspecified: Secondary | ICD-10-CM

## 2019-04-20 DIAGNOSIS — Z79899 Other long term (current) drug therapy: Secondary | ICD-10-CM | POA: Diagnosis not present

## 2019-04-20 DIAGNOSIS — N7093 Salpingitis and oophoritis, unspecified: Secondary | ICD-10-CM | POA: Diagnosis not present

## 2019-04-20 DIAGNOSIS — Q501 Developmental ovarian cyst: Secondary | ICD-10-CM

## 2019-04-20 MED ORDER — OXYCODONE HCL 5 MG PO TABS: 5.0000 mg | ORAL_TABLET | ORAL | 0 refills | Status: DC | PRN

## 2019-04-20 MED ORDER — SENNOSIDES-DOCUSATE SODIUM 8.6-50 MG PO TABS: 2.0000 | ORAL_TABLET | Freq: Every day | ORAL | 1 refills | Status: AC

## 2019-04-20 MED ORDER — IBUPROFEN 800 MG PO TABS: 800.0000 mg | ORAL_TABLET | Freq: Three times a day (TID) | ORAL | 1 refills | Status: AC | PRN

## 2019-04-20 NOTE — Progress Notes (Signed)
Consult Note: Gyn-Onc  Consult was requested by Dr. Nelva Bush for the evaluation of Megan Gibson 48 y.o. female  CC:  Chief Complaint  Patient presents with  . Ovarian Cyst  . Pelvic Inflammatory Disease    Assessment/Plan:  Megan Gibson  is a 48 y.o.  year old with a history of PID and bilateral adnexal masses (Lt greater than Rt).  I discussed that total hysterectomy with BSO is a good option to decrease the likelihood of recurrent PID given that she is already experienced 1 flareup of this after initial diagnosis.  I discussed, however, that surgery should be performed several months after her most recent bout of PID as there is an exceptionally high risk for complications and even postoperative sepsis or mortality when operating the setting of acute pelvic inflammatory disease and infection.  Therefore we will plan a surgical intervention in February or March.  We will obtain repeat imaging close to that time to evaluate for regression or progression of her adnexal masses.  I spent time speaking with Megan Gibson about the procedure.  I expect a robotic assisted procedure will be feasible.  It will require likely extensive lysis of adhesions.  There is a potential for colonic surgery, or urologic surgery necessary.  She will have a bowel prep with antibiotics preoperatively to comply with the colon bundle protocol.  I discussed that we will have her abdomen marked for possible colostomy should this be necessary as a temporary measure.    I counseled her regarding the major serious complications that can be associated with hysterectomy for pelvic inflammatory disease including but not limited to surgical site infection risks which are particularly high particular if laparotomy is performed, visceral injury particular to GI or GU structures, VTE, sepsis, and death.  She is agreeable with our plan for surgery and will notify us if she experiences new symptoms of PID prior to that  time.   HPI: [name] is a 48 year old P2 who is seen in consultation at the request of Dr. Nelva Bush for evaluation of pelvic inflammatory disease and bilateral tubo-ovarian abscesses.  The patient initially had a bout of pelvic inflammatory disease in August 2019.  This was felt to be subsequent to an episode of diverticulitis and at the time was felt to be diverticulitis and not pelvic inflammatory disease.  In October 2020 she began experiencing left-sided pelvic bloating and pain.  She denied fever.  She was evaluated seen and admitted to [facility] with a diagnosis of tubo-ovarian abscesses and pelvic inflammatory disease.  She was treated with IV antibiotics over 48 hours.  Then discharged home on 2-week course of oral antibiotics including Flagyl and possibly Augmentin.  During that admission a CT scan of the abdomen and pelvis was performed on March 14, 2019.  This revealed small volume pelvic ascites with pelvic mesenteric stranding correlate for PID.  Prior secondary inflammatory changes involving the sigmoid colon were not evident on the current study.  A transvaginal ultrasound was performed the following day on March 15, 2019.  This revealed a uterus measuring 8.9 x 5.2 x 6.1 cm with no fibroids or other masses visualized.  The endometrium was 10 mm.  The right ovary measured 3 x 1.7 x 1.7 cm and contained a 2.8 cm complex cystic area.  The left ovary measured 3.5 x 2 x 2.7 cm and contained multiple complex cystic areas in the adnexa including a 4.5 x 2.9 x 2.2 cm complex  area lateral to the left ovary and a 3.7 x 4 x 2.8 cm area medial to the left ovary.  She subsequently felt much better and at the time of her initial consultation with me had no symptoms concerning for PAD.  A Ca1 25 had been ordered in the summer 2020 (10/28/2018) and was normal at 8.  The patient is otherwise a fairly healthy woman.  She has a history of a cholecystectomy, 2 prior cesarean  sections, and no vaginal deliveries.   She has had a prior endometrial ablation and tubal ligation.    Her mother had 3 cancers including breast, ovarian, and colon cancer.  She ultimately died from colon cancer.  She was never genetically tested.  The patient herself has had genetic consultation performed here at [facility] and this was negative for deleterious mutations.  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  She lives with her husband and 2 children age 40 and 51.  Current Meds:  Outpatient Encounter Medications as of 04/20/2019  Medication Sig  . acetaminophen (TYLENOL) 500 MG tablet Take 2 tablets (1,000 mg total) by mouth every 6 (six) hours as needed for mild pain, moderate pain or fever (or Fever >/= 101). (Patient not taking: Reported on 03/15/2019)  . fexofenadine (ALLEGRA) 180 MG tablet Take 180 mg by mouth at bedtime.   . fluticasone (FLONASE) 50 MCG/ACT nasal spray One spray nostril daily for 2 weeks then stop   Give generic (Patient taking differently: Place 1 spray into both nostrils daily as needed for allergies or rhinitis. )  . ibuprofen (ADVIL) 800 MG tablet Take 1 tablet (800 mg total) by mouth every 8 (eight) hours as needed for moderate pain. For AFTER surgery  . oxyCODONE (OXY IR/ROXICODONE) 5 MG immediate release tablet Take 1 tablet (5 mg total) by mouth every 4 (four) hours as needed for severe pain. For AFTER surgery, do not take and drive  . pseudoephedrine (SUDAFED) 30 MG tablet Take 30 mg by mouth every 4 (four) hours as needed for congestion.  . Psyllium (METAMUCIL PO) Take 1 Scoop by mouth daily as needed (constipation).  Marland Kitchen senna-docusate (SENOKOT-S) 8.6-50 MG tablet Take 2 tablets by mouth at bedtime. For AFTER surgery, do not take if having diarrhea  . [DISCONTINUED] ibuprofen (ADVIL) 800 MG tablet Take 1 tablet (800 mg total) by mouth 3 (three) times daily with meals as needed for headache, mild pain, moderate pain or cramping.  .  [DISCONTINUED] senna-docusate (SENOKOT-S) 8.6-50 MG tablet Take 1 tablet by mouth 2 (two) times daily as needed for mild constipation or moderate constipation.   No facility-administered encounter medications on file as of 04/20/2019.     Allergy: No Known Allergies  Social Hx:   Social History   Socioeconomic History  . Marital status: Married    Spouse name: Not on file  . Number of children: 1  . Years of education: Not on file  . Highest education level: Not on file  Occupational History  . Not on file  Social Needs  . Financial resource strain: Not on file  . Food insecurity    Worry: Not on file    Inability: Not on file  . Transportation needs    Medical: Not on file    Non-medical: Not on file  Tobacco Use  . Smoking status: Never Smoker  . Smokeless tobacco: Never Used  Substance and Sexual Activity  . Alcohol use: Yes    Alcohol/week:  0.0 standard drinks    Comment: 5/month  . Drug use: No  . Sexual activity: Yes    Partners: Male    Birth control/protection: Surgical  Lifestyle  . Physical activity    Days per week: Not on file    Minutes per session: Not on file  . Stress: Not on file  Relationships  . Social Herbalist on phone: Not on file    Gets together: Not on file    Attends religious service: Not on file    Active member of club or organization: Not on file    Attends meetings of clubs or organizations: Not on file    Relationship status: Not on file  . Intimate partner violence    Fear of current or ex partner: Not on file    Emotionally abused: Not on file    Physically abused: Not on file    Forced sexual activity: Not on file  Other Topics Concern  . Not on file  Social History Narrative  . Not on file    Past Surgical Hx:  Past Surgical History:  Procedure Laterality Date  . ABLATION  11/2011  . CESAREAN SECTION    . CHOLECYSTECTOMY    . TUBAL LIGATION      Past Medical Hx:  Past Medical History:  Diagnosis Date   . Anxiety   . Family history of breast cancer in mother   . Family history of breast cancer in mother   . Family history of colon cancer in mother   . Family history of ovarian cancer   . Family history of pancreatic cancer   . History of abnormal cervical Pap smear   . Seasonal allergies     Past Gynecological History:  See HPI No LMP recorded.  Family Hx:  Family History  Problem Relation Age of Onset  . Breast cancer Mother        dx in her 33s  . Ovarian cancer Mother 62  . Colon cancer Mother 23  . Skin cancer Mother   . Heart disease Father   . Diabetes Father   . High blood pressure Father   . Heart attack Father   . Diabetes type II Father   . Diabetes Maternal Grandmother   . Heart disease Maternal Grandmother   . Pancreatic cancer Maternal Grandfather        dx and died in his 28s  . Ovarian cancer Maternal Aunt        dx in her 31s  . Lung cancer Maternal Uncle   . Multiple sclerosis Maternal Aunt   . Prostate cancer Maternal Uncle        metastized to liver  . Breast cancer Cousin        maternal first cousin dx in her 77s  . Cancer Cousin        maternal first cousin dx with cancer NOS, causing her to have a TAH-BSO in her 21s    Review of Systems:  Constitutional  Feels well,    ENT Normal appearing ears and nares bilaterally Skin/Breast  No rash, sores, jaundice, itching, dryness Cardiovascular  No chest pain, shortness of breath, or edema  Pulmonary  No cough or wheeze.  Gastro Intestinal  No nausea, vomitting, or diarrhoea. No bright red blood per rectum, no abdominal pain, change in bowel movement, or constipation.  Genito Urinary  No frequency, urgency, dysuria, no pain Musculo Skeletal  No myalgia, arthralgia, joint swelling or  pain  Neurologic  No weakness, numbness, change in gait,  Psychology  No depression, anxiety, insomnia.   Vitals:  Blood pressure 135/83, pulse 100, temperature 98.2 F (36.8 C), temperature source  Temporal, resp. rate 20, height 5\' 3"  (1.6 m), weight 175 lb 11.2 oz (79.7 kg), SpO2 100 %.  Physical Exam: WD in NAD Neck  Supple NROM, without any enlargements.  Lymph Node Survey No cervical supraclavicular or inguinal adenopathy Cardiovascular  Pulse normal rate, regularity and rhythm. S1 and S2 normal.  Lungs  Clear to auscultation bilateraly, without wheezes/crackles/rhonchi. Good air movement.  Skin  No rash/lesions/breakdown  Psychiatry  Alert and oriented to person, place, and time  Abdomen  Normoactive bowel sounds, abdomen soft, non-tender and non obese without evidence of hernia.  Back No CVA tenderness Genito Urinary  Vulva/vagina: Normal external female genitalia.  No lesions. No discharge or bleeding.  Bladder/urethra:  No lesions or masses, well supported bladder  Vagina: normal  Cervix: Normal appearing, no lesions.  Uterus:  Small, mobile, no parametrial involvement or nodularity.  Adnexa: no discretely palpable masses. Rectal  Good tone, no masses no cul de sac nodularity.  Extremities  No bilateral cyanosis, clubbing or edema.   Thereasa Solo, MD  04/20/2019, 5:10 PM

## 2019-04-20 NOTE — Patient Instructions (Signed)
Preparing for your Surgery  Plan for surgery on July 15, 2019 with Dr. Everitt Amber at [place] will be scheduled for a robotic assisted total laparoscopic hysterectomy, bilateral salpingo-oophorectomy, lysis of adhesions, possible bowel resection.   Pre-operative Testing -You will receive a phone call from presurgical testing at [facility] if you have not received a call already to arrange for a pre-operative testing appointment before your surgery.  This appointment normally occurs one to two weeks before your scheduled surgery.   -Bring your insurance card, copy of an advanced directive if applicable, medication list  -At that visit, you will be asked to sign a consent for a possible blood transfusion in case a transfusion becomes necessary during surgery.  The need for a blood transfusion is rare but having consent is a necessary part of your care.     -You should not be taking blood thinners or aspirin at least ten days prior to surgery unless instructed by your surgeon.  -Do not take supplements such as fish oil (omega 3), red yeast rice, tumeric before your surgery.  Day Before Surgery at [place] will be asked to take in a light diet the day before surgery.  Avoid carbonated beverages.  You will be advised to have nothing to eat or drink after midnight the evening before.    Eat a light diet the day before surgery.  Examples including soups, broths, toast, yogurt, mashed potatoes.  Things to avoid include carbonated beverages (fizzy beverages), raw fruits and raw vegetables, or beans.   If your bowels are filled with gas, your surgeon will have difficulty visualizing your pelvic organs which increases your surgical risks.  Your role in recovery Your role is to become active as soon as directed by your doctor, while still giving yourself time to heal.  Rest when you feel tired. You will be asked to do the following in order to speed your recovery:  -  Cough and breathe deeply. This helps toclear and expand your lungs and can prevent pneumonia.  - Do mild physical activity. Walking or moving your legs help your circulation and body functions return to normal. A staff member will help you when you try to walk and will provide you with simple exercises. Do not try to get up or walk alone the first time. - Actively manage your pain. Managing your pain lets you move in comfort. We will ask you to rate your pain on a scale of zero to 10. It is your responsibility to tell your doctor or nurse where and how much you hurt so your pain can be treated.  Special Considerations -If you are diabetic, you may be placed on insulin after surgery to have closer control over your blood sugars to promote healing and recovery.  This does not mean that you will be discharged on insulin.  If applicable, your oral antidiabetics will be resumed when you are tolerating a solid diet.  -Your final pathology results from surgery should be available around one week after surgery and the results will be relayed to you when available.  -Dr. Lahoma Crocker is the surgeon that assists your GYN Oncologist with surgery.  If you end up staying the night, the next day after your surgery you will either see Dr. Denman George or Dr. Lahoma Crocker.  -FMLA forms can be faxed to [phone] and please allow 5-7 business days for completion.  Pain Management After Surgery -You have been prescribed your pain medication and  bowel regimen medications before surgery so that you can have these available when you are discharged from the hospital. The pain medication is for use ONLY AFTER surgery and a new prescription will not be given.   -Make sure that you have Tylenol and Ibuprofen at home to use on a regular basis after surgery for pain control. We recommend alternating the medications every hour to six hours since they work differently and are processed in the body differently for pain  relief.  -Review the attached handout on narcotic use and their risks and side effects.   Bowel Regimen -You have been prescribed Sennakot-S to take nightly to prevent constipation especially if you are taking the narcotic pain medication intermittently.  It is important to prevent constipation and drink adequate amounts of liquids.  Blood Transfusion Information WHAT IS A BLOOD TRANSFUSION? A transfusion is the replacement of blood or some of its parts. Blood is made up of multiple cells which provide different functions.  Red blood cells carry oxygen and are used for blood loss replacement.  White blood cells fight against infection.  Platelets control bleeding.  Plasma helps clot blood.  Other blood products are available for specialized needs, such as hemophilia or other clotting disorders. BEFORE THE TRANSFUSION  Who gives blood for transfusions?   You may be able to donate blood to be used at a later date on yourself (autologous donation).  Relatives can be asked to donate blood. This is generally not any safer than if you have received blood from a stranger. The same precautions are taken to ensure safety when a relative's blood is donated.  Healthy volunteers who are fully evaluated to make sure their blood is safe. This is blood bank blood. Transfusion therapy is the safest it has ever been in the practice of medicine. Before blood is taken from a donor, a complete history is taken to make sure that person has no history of diseases nor engages in risky social behavior (examples are intravenous drug use or sexual activity with multiple partners). The donor's travel history is screened to minimize risk of transmitting infections, such as malaria. The donated blood is tested for signs of infectious diseases, such as HIV and hepatitis. The blood is then tested to be sure it is compatible with you in order to minimize the chance of a transfusion reaction. If you or a relative donates  blood, this is often done in anticipation of surgery and is not appropriate for emergency situations. It takes many days to process the donated blood. RISKS AND COMPLICATIONS Although transfusion therapy is very safe and saves many lives, the main dangers of transfusion include:   Getting an infectious disease.  Developing a transfusion reaction. This is an allergic reaction to something in the blood you were given. Every precaution is taken to prevent this. The decision to have a blood transfusion has been considered carefully by your caregiver before blood is given. Blood is not given unless the benefits outweigh the risks.  AFTER SURGERY INSTRUCTIONS 04/20/2019  Return to work: 4-6 weeks if applicable  Activity: 1. Be up and out of the bed during the day.  Take a nap if needed.  You may walk up steps but be careful and use the hand rail.  Stair climbing will tire you more than you think, you may need to stop part way and rest.   2. No lifting or straining for 6 weeks.  3. No driving for 1 week(s).  Do not drive  if you are taking narcotic pain medicine.  4. Shower daily.  Use soap and water on your incision and pat dry; don't rub.  No tub baths until cleared by your surgeon.   5. No sexual activity and nothing in the vagina for 8 weeks.  6. You may experience a small amount of clear drainage from your incisions, which is normal.  If the drainage persists or increases, please call the office.  7. You may experience vaginal spotting after surgery or around the 6-8 week mark from surgery when the stitches at the top of the vagina begin to dissolve.  The spotting is normal but if you experience heavy bleeding, call our office.  8. Take Tylenol or ibuprofen first for pain and only use narcotic pain medication for severe pain not relieved by the Tylenol or Ibuprofen.  Monitor your Tylenol intake to a max of 4,000 mg.  Diet: 1. Low sodium Heart Healthy Diet is recommended.  2. It is safe  to use a laxative, such as Miralax or Colace, if you have difficulty moving your bowels. You can take Sennakot at bedtime every evening to keep bowel movements regular and to prevent constipation.    Wound Care: 1. Keep clean and dry.  Shower daily.  Reasons to call the Doctor:  Fever - Oral temperature greater than 100.4 degrees Fahrenheit  Foul-smelling vaginal discharge  Difficulty urinating  Nausea and vomiting  Increased pain at the site of the incision that is unrelieved with pain medicine.  Difficulty breathing with or without chest pain  New calf pain especially if only on one side  Sudden, continuing increased vaginal bleeding with or without clots.   Contacts: For questions or concerns you should contact:  Dr. Everitt Amber at [phone]  Joylene John, NP at [phone]  After Hours: call [phone] and have the GYN Oncologist paged/contacted

## 2019-04-21 ENCOUNTER — Other Ambulatory Visit: Payer: Self-pay | Admitting: Gynecologic Oncology

## 2019-04-21 ENCOUNTER — Telehealth: Payer: Self-pay

## 2019-04-21 DIAGNOSIS — N739 Female pelvic inflammatory disease, unspecified: Secondary | ICD-10-CM

## 2019-04-21 MED ORDER — ERYTHROMYCIN BASE 500 MG PO TABS: ORAL_TABLET | ORAL | 0 refills | Status: DC

## 2019-04-21 MED ORDER — NEOMYCIN SULFATE 500 MG PO TABS: ORAL_TABLET | ORAL | 0 refills | Status: DC

## 2019-04-21 NOTE — Telephone Encounter (Signed)
Told Megan Gibson that Dr. Denman George  has decided to have her do a bowel prep with antibiotics. The instructions will be mailed to her home. Reviewed instructions over the phone. Antibiotics can be sent to [facility] [address] in [place], [place]. Megan Gibson can call if she has any questions about the prep when she receives the instructions.

## 2019-04-26 ENCOUNTER — Telehealth: Payer: Self-pay | Admitting: *Deleted

## 2019-04-26 NOTE — Telephone Encounter (Signed)
Called and left the patient a message to call the office back. Need to get records from her colonoscopy in [facility]

## 2019-04-27 ENCOUNTER — Telehealth: Payer: Self-pay | Admitting: *Deleted

## 2019-04-27 NOTE — Telephone Encounter (Signed)
Called and left a message for the patient to call the office.

## 2019-04-27 NOTE — Telephone Encounter (Signed)
Patient called back and stated her colonoscopy was done at [facility] in July 2020. Fax request for the records

## 2019-06-08 ENCOUNTER — Encounter (HOSPITAL_COMMUNITY): Payer: Self-pay | Admitting: Physician Assistant

## 2019-06-09 ENCOUNTER — Telehealth: Payer: Self-pay | Admitting: Gynecologic Oncology

## 2019-06-09 NOTE — Telephone Encounter (Signed)
Attempted to call patient about the changes to her surgery in relation to hospital changes due to [place].  Left message asking her to please call the office to discuss.

## 2019-06-10 ENCOUNTER — Encounter: Payer: Self-pay | Admitting: Gynecologic Oncology

## 2019-06-28 ENCOUNTER — Ambulatory Visit (HOSPITAL_COMMUNITY)
Admission: RE | Admit: 2019-06-28 | Discharge: 2019-06-28 | Disposition: A | Discharge status: Home / Self Care | Payer: BLUE CROSS/BLUE SHIELD | Source: Ambulatory Visit | Attending: Gynecologic Oncology | Admitting: Gynecologic Oncology

## 2019-06-28 ENCOUNTER — Other Ambulatory Visit: Payer: Self-pay

## 2019-06-28 DIAGNOSIS — N739 Female pelvic inflammatory disease, unspecified: Secondary | ICD-10-CM | POA: Insufficient documentation

## 2019-06-28 MED ORDER — IOHEXOL 300 MG/ML  SOLN
100.0000 mL | Freq: Once | INTRAMUSCULAR | Status: AC | PRN
  Administered 2019-06-28: 100 mL via INTRAVENOUS

## 2019-06-28 MED ORDER — SODIUM CHLORIDE (PF) 0.9 % IJ SOLN
INTRAMUSCULAR | Status: AC
  Filled 2019-06-28: qty 50

## 2019-06-29 ENCOUNTER — Telehealth: Payer: Self-pay

## 2019-06-29 NOTE — Telephone Encounter (Signed)
Told Ms Roytman that  The CT shows improvement in the fluid loculations in the pelvis and bilateral adnexa per Joylene John, NP.  She can proceed with surgery as scheduled for 07-15-19.  Pt verbalized understanding.

## 2019-07-05 ENCOUNTER — Encounter: Payer: Self-pay | Admitting: Gynecologic Oncology

## 2019-07-06 ENCOUNTER — Telehealth: Payer: Self-pay | Admitting: Oncology

## 2019-07-06 NOTE — Telephone Encounter (Signed)
Notified Wound Ostomy nurse, Santiago Glad regarding need for marking patient's abdomen for 07/15/19 surgery in case a stoma (temporary) is needed.  She said patient will be seen at her pre-op appointment on 07/12/19.

## 2019-07-07 ENCOUNTER — Encounter (HOSPITAL_COMMUNITY): Payer: Self-pay

## 2019-07-07 NOTE — Patient Instructions (Addendum)
DUE TO COVID-19 ONLY ONE VISITOR IS ALLOWED TO COME WITH YOU AND STAY IN THE WAITING ROOM ONLY DURING PRE OP AND PROCEDURE. THE ONE VISITOR MAY VISIT WITH YOU IN YOUR PRIVATE ROOM DURING VISITING HOURS ONLY!!   COVID SWAB TESTING MUST BE COMPLETED ON:   Monday, Feb. 15, 2021 at 11:05 AM   [address] [place] [facility] enter pre surgical testing line (Must self quarantine after testing. Follow instructions on handout.)             Your procedure is scheduled on: Thursday, Feb. 18, 2021   Report to [facility] Main  Entrance   Report to Short Stay at 5:30 AM   Call this number if you have problems the morning of surgery [phone]   Drink 2 pre surgery Ensure drinks at 10:00PM the night before surgery   Do not eat food:After Midnight.   May have liquids until 4:30 AM the day of surgery   CLEAR LIQUID DIET  Foods Allowed                                                                     Foods Excluded  Water, Black Coffee and tea, regular and decaf                             liquids that you cannot  Plain Jell-O in any flavor  (No red)                                           see through such as: Fruit ices (not with fruit pulp)                                     milk, soups, orange juice  Iced Popsicles (No red)                                    All solid food Carbonated beverages, regular and diet                                    Apple juices Sports drinks like Gatorade (No red) Lightly seasoned clear broth or consume(fat free) Sugar, honey syrup  Sample Menu Breakfast                                Lunch                                     Supper Cranberry juice                    Beef broth  Chicken broth Jell-O                                     Grape juice                           Apple juice Coffee or tea                        Jell-O                                      Popsicle                               Coffee or tea                        Coffee or tea   Drink last pre surgery Ensure drink at 4:30 AM the morning of surgery  Oral Hygiene is also important to reduce your risk of infection.                                    Remember - BRUSH YOUR TEETH THE MORNING OF SURGERY WITH YOUR REGULAR TOOTHPASTE   Do NOT smoke after Midnight   Take these medicines the morning of surgery with A SIP OF WATER: Per surgeon's instructions                               You may not have any metal on your body including hair pins, jewelry, and body piercings             Do not wear make-up, lotions, powders, perfumes/cologne, or deodorant             Do not wear nail polish.  Do not shave  48 hours prior to surgery.              Do not bring valuables to the hospital. [place].   Contacts, dentures or bridgework may not be worn into surgery.    Patients discharged the day of surgery will not be allowed to drive home.   Special Instructions: Bring a copy of your healthcare power of attorney and living will documents         the day of surgery if you haven't scanned them in before.              Please read over the following fact sheets you were given:  [facility] - Preparing for Surgery Before surgery, you can play an important role.  Because skin is not sterile, your skin needs to be as free of germs as possible.  You can reduce the number of germs on your skin by washing with CHG (chlorahexidine gluconate) soap before surgery.  CHG is an antiseptic cleaner which kills germs and bonds with the skin to continue killing germs even after washing. Please DO NOT use if you have an allergy to CHG or antibacterial  soaps.  If your skin becomes reddened/irritated stop using the CHG and inform your nurse when you arrive at Short Stay. Do not shave (including legs and underarms) for at least 48 hours prior to the first CHG shower.  You  may shave your face/neck.  Please follow these instructions carefully:  1.  Shower with CHG Soap the night before surgery and the  morning of surgery.  2.  If you choose to wash your hair, wash your hair first as usual with your normal  shampoo.  3.  After you shampoo, rinse your hair and body thoroughly to remove the shampoo.                             4.  Use CHG as you would any other liquid soap.  You can apply chg directly to the skin and wash.  Gently with a scrungie or clean washcloth.  5.  Apply the CHG Soap to your body ONLY FROM THE NECK DOWN.   Do   not use on face/ open                           Wound or open sores. Avoid contact with eyes, ears mouth and   genitals (private parts).                       Wash face,  Genitals (private parts) with your normal soap.             6.  Wash thoroughly, paying special attention to the area where your    surgery  will be performed.  7.  Thoroughly rinse your body with warm water from the neck down.  8.  DO NOT shower/wash with your normal soap after using and rinsing off the CHG Soap.                9.  Pat yourself dry with a clean towel.            10.  Wear clean pajamas.            11.  Place clean sheets on your bed the night of your first shower and do not  sleep with pets. Day of Surgery : Do not apply any lotions/deodorants the morning of surgery.  Please wear clean clothes to the hospital/surgery center.  FAILURE TO FOLLOW THESE INSTRUCTIONS MAY RESULT IN THE CANCELLATION OF YOUR SURGERY  PATIENT SIGNATURE_________________________________  NURSE SIGNATURE__________________________________  ________________________________________________________________________

## 2019-07-08 ENCOUNTER — Other Ambulatory Visit: Payer: Self-pay | Admitting: Gynecologic Oncology

## 2019-07-08 DIAGNOSIS — N739 Female pelvic inflammatory disease, unspecified: Secondary | ICD-10-CM

## 2019-07-10 ENCOUNTER — Encounter: Payer: Self-pay | Admitting: Gynecologic Oncology

## 2019-07-12 ENCOUNTER — Other Ambulatory Visit: Payer: Self-pay

## 2019-07-12 ENCOUNTER — Encounter (HOSPITAL_COMMUNITY): Payer: Self-pay

## 2019-07-12 ENCOUNTER — Other Ambulatory Visit (HOSPITAL_COMMUNITY)
Admission: RE | Admit: 2019-07-12 | Discharge: 2019-07-12 | Disposition: A | Discharge status: Home / Self Care | Payer: BLUE CROSS/BLUE SHIELD | Source: Ambulatory Visit | Attending: Gynecologic Oncology | Admitting: Gynecologic Oncology

## 2019-07-12 ENCOUNTER — Encounter (HOSPITAL_COMMUNITY)
Admission: RE | Admit: 2019-07-12 | Discharge: 2019-07-12 | Disposition: A | Discharge status: Home / Self Care | Payer: BLUE CROSS/BLUE SHIELD | Source: Ambulatory Visit | Attending: Gynecologic Oncology | Admitting: Gynecologic Oncology

## 2019-07-12 DIAGNOSIS — Z20822 Contact with and (suspected) exposure to covid-19: Secondary | ICD-10-CM | POA: Diagnosis not present

## 2019-07-12 DIAGNOSIS — Z01812 Encounter for preprocedural laboratory examination: Secondary | ICD-10-CM | POA: Diagnosis not present

## 2019-07-12 HISTORY — DX: Personal history of other diseases of the digestive system: Z87.19

## 2019-07-12 HISTORY — DX: Anemia, unspecified: D64.9

## 2019-07-12 HISTORY — DX: Female pelvic inflammatory disease, unspecified: N73.9

## 2019-07-12 HISTORY — DX: Other specified conditions associated with female genital organs and menstrual cycle: N94.89

## 2019-07-12 LAB — COMPREHENSIVE METABOLIC PANEL
ALT: 27 U/L (ref 0–44)
AST: 20 U/L (ref 15–41)
Albumin: 4.3 g/dL (ref 3.5–5.0)
Alkaline Phosphatase: 59 U/L (ref 38–126)
Anion gap: 8 (ref 5–15)
BUN: 11 mg/dL (ref 6–20)
CO2: 23 mmol/L (ref 22–32)
Calcium: 9.3 mg/dL (ref 8.9–10.3)
Chloride: 106 mmol/L (ref 98–111)
Creatinine, Ser: 0.65 mg/dL (ref 0.44–1.00)
GFR calc Af Amer: >60 mL/min (ref >60)
GFR calc non Af Amer: >60 mL/min (ref >60)
Glucose, Bld: 77 mg/dL (ref 70–99)
Potassium: 4.2 mmol/L (ref 3.5–5.1)
Sodium: 137 mmol/L (ref 135–145)
Total Bilirubin: 0.7 mg/dL (ref 0.3–1.2)
Total Protein: 7.5 g/dL (ref 6.5–8.1)

## 2019-07-12 LAB — URINALYSIS, ROUTINE W REFLEX MICROSCOPIC
Bilirubin Urine: NEGATIVE
Glucose, UA: NEGATIVE mg/dL
Ketones, ur: NEGATIVE mg/dL
Leukocytes,Ua: NEGATIVE
Nitrite: NEGATIVE
Protein, ur: NEGATIVE mg/dL
Specific Gravity, Urine: 1.005 (ref 1.005–1.030)
pH: 6 (ref 5.0–8.0)

## 2019-07-12 LAB — CBC
HCT: 46.6 % — ABNORMAL HIGH (ref 36.0–46.0)
Hemoglobin: 15.4 g/dL — ABNORMAL HIGH (ref 12.0–15.0)
MCH: 28.1 pg (ref 26.0–34.0)
MCHC: 33 g/dL (ref 30.0–36.0)
MCV: 84.9 fL (ref 80.0–100.0)
Platelets: 237 10*3/uL (ref 150–400)
RBC: 5.49 MIL/uL — ABNORMAL HIGH (ref 3.87–5.11)
RDW: 12.7 % (ref 11.5–15.5)
WBC: 8.1 10*3/uL (ref 4.0–10.5)
nRBC: 0 % (ref 0.0–0.2)

## 2019-07-12 LAB — SARS CORONAVIRUS 2 (TAT 6-24 HRS): SARS Coronavirus 2: NEGATIVE

## 2019-07-12 LAB — ABO/RH: ABO/RH(D): O POS

## 2019-07-12 LAB — PREGNANCY, URINE: Preg Test, Ur: NEGATIVE

## 2019-07-12 NOTE — Progress Notes (Signed)
PCP - Dr. Nolberto Hanlon Cardiologist - N/A  Chest x-ray - N/A EKG - N/A Stress Test - N/A ECHO - N/A Cardiac Cath - N/A  Sleep Study - N/A CPAP - N/A  Fasting Blood Sugar - N/A Checks Blood Sugar _N/A____ times a day  Blood Thinner Instructions: N/A Aspirin Instructions: N/A Last Dose: N/A  Anesthesia review:  N/A  Patient denies shortness of breath, fever, cough and chest pain at PAT appointment   Patient verbalized understanding of instructions that were given to them at the PAT appointment. Patient was also instructed that they will need to review over the PAT instructions again at home before surgery.

## 2019-07-12 NOTE — Consult Note (Signed)
.  [place] Nurse requested for preoperative stoma site marking  Discussed surgical procedure and stoma creation with patient and family.  Explained role of the [place] nurse team.  Provided the patient with educational booklet and provided samples of pouching options.  Answered patient and family questions.   Examined patient lying, sitting, and standing in order to place the marking in the patient's visual field, away from any creases or abdominal contour issues and within the rectus muscle.  Attempted to mark below the patient's belt line.   Marked for colostomy in the LLQ  _2___ cm to the left of the umbilicus and [id number] below the umbilicus.  Marked for ileostomy in the RLQ  _2.5___cm to the right of the umbilicus and  [id number] cm below the umbilicus.  .   Patient's abdomen cleansed with CHG wipes at site markings, allowed to air dry prior to marking.Covered mark with thin film transparent dressing to preserve mark until date of surgery.   [place] Nurse team will follow up with patient after surgery for continue ostomy care and teaching.  [place] MSN, [place], [place], [place]

## 2019-07-13 ENCOUNTER — Encounter: Payer: Self-pay | Admitting: Gynecologic Oncology

## 2019-07-14 ENCOUNTER — Telehealth: Payer: Self-pay

## 2019-07-14 MED ORDER — SODIUM CHLORIDE 0.9 % IV SOLN
2.0000 g | INTRAVENOUS | Status: AC
  Administered 2019-07-15: 2 g via INTRAVENOUS
  Filled 2019-07-14: qty 2

## 2019-07-14 NOTE — Telephone Encounter (Signed)
Pt did not receive the bowel prep instructions sent in November for surgery. Sent the instructions by My Chart. Pt began Mag citrate ~1pm as she had to go out and purchase mag citrate. She had ATB. Reviewed delay in bowel prep with Joylene John, NP. Move forward with prep from Mag citrate an ATB as noted. Told Ms Jarrin that Dr. Denman George will be at the hospital tomorrow even  with inclement weather possible. Pt verbalized understanding for all instructions reviewed.

## 2019-07-14 NOTE — Anesthesia Preprocedure Evaluation (Addendum)
Anesthesia Evaluation  Patient identified by MRN, date of birth, ID band Patient awake    Reviewed: Allergy & Precautions, NPO status , Patient's Chart, lab work & pertinent test results  History of Anesthesia Complications Negative for: history of anesthetic complications  Airway Mallampati: II  TM Distance: >3 FB Neck ROM: Full    Dental no notable dental hx.    Pulmonary neg pulmonary ROS,    Pulmonary exam normal        Cardiovascular negative cardio ROS Normal cardiovascular exam     Neuro/Psych Anxiety negative neurological ROS     GI/Hepatic negative GI ROS, Neg liver ROS,   Endo/Other  negative endocrine ROS  Renal/GU negative Renal ROS  negative genitourinary   Musculoskeletal negative musculoskeletal ROS (+)   Abdominal   Peds  Hematology negative hematology ROS (+)   Anesthesia Other Findings Day of surgery medications reviewed with patient.  Reproductive/Obstetrics PELVIC INFLAMITORY DISEASE, PELVIC ADHESIONS                            Anesthesia Physical Anesthesia Plan  ASA: II  Anesthesia Plan: General   Post-op Pain Management:    Induction: Intravenous  PONV Risk Score and Plan: 4 or greater and Midazolam, Ondansetron, Dexamethasone, Treatment may vary due to age or medical condition and Scopolamine patch - Pre-op  Airway Management Planned: Oral ETT  Additional Equipment: None  Intra-op Plan:   Post-operative Plan: Extubation in OR  Informed Consent: I have reviewed the patients History and Physical, chart, labs and discussed the procedure including the risks, benefits and alternatives for the proposed anesthesia with the patient or authorized representative who has indicated his/her understanding and acceptance.     Dental advisory given  Plan Discussed with: CRNA  Anesthesia Plan Comments:        Anesthesia Quick Evaluation

## 2019-07-15 ENCOUNTER — Encounter (HOSPITAL_COMMUNITY): Payer: Self-pay | Admitting: Gynecologic Oncology

## 2019-07-15 ENCOUNTER — Ambulatory Visit (HOSPITAL_COMMUNITY): Payer: BLUE CROSS/BLUE SHIELD | Admitting: Anesthesiology

## 2019-07-15 ENCOUNTER — Ambulatory Visit (HOSPITAL_COMMUNITY)
Admission: RE | Admit: 2019-07-15 | Discharge: 2019-07-15 | Disposition: A | Discharge status: Home / Self Care | Payer: BLUE CROSS/BLUE SHIELD | Attending: Gynecologic Oncology | Admitting: Gynecologic Oncology

## 2019-07-15 ENCOUNTER — Ambulatory Visit: Admit: 2019-07-15 | Payer: BLUE CROSS/BLUE SHIELD | Admitting: Gynecologic Oncology

## 2019-07-15 ENCOUNTER — Ambulatory Visit (HOSPITAL_COMMUNITY): Payer: BLUE CROSS/BLUE SHIELD | Admitting: Physician Assistant

## 2019-07-15 ENCOUNTER — Encounter (HOSPITAL_COMMUNITY)
Admission: RE | Disposition: A | Discharge status: Home / Self Care | Payer: Self-pay | Source: Home / Self Care | Attending: Gynecologic Oncology

## 2019-07-15 DIAGNOSIS — N7011 Chronic salpingitis: Secondary | ICD-10-CM | POA: Insufficient documentation

## 2019-07-15 DIAGNOSIS — N736 Female pelvic peritoneal adhesions (postinfective): Secondary | ICD-10-CM | POA: Insufficient documentation

## 2019-07-15 DIAGNOSIS — N83291 Other ovarian cyst, right side: Secondary | ICD-10-CM

## 2019-07-15 DIAGNOSIS — N739 Female pelvic inflammatory disease, unspecified: Secondary | ICD-10-CM

## 2019-07-15 DIAGNOSIS — N7093 Salpingitis and oophoritis, unspecified: Secondary | ICD-10-CM | POA: Diagnosis not present

## 2019-07-15 DIAGNOSIS — N83202 Unspecified ovarian cyst, left side: Secondary | ICD-10-CM | POA: Insufficient documentation

## 2019-07-15 DIAGNOSIS — N730 Acute parametritis and pelvic cellulitis: Secondary | ICD-10-CM

## 2019-07-15 DIAGNOSIS — N838 Other noninflammatory disorders of ovary, fallopian tube and broad ligament: Secondary | ICD-10-CM | POA: Diagnosis not present

## 2019-07-15 DIAGNOSIS — N83292 Other ovarian cyst, left side: Secondary | ICD-10-CM

## 2019-07-15 DIAGNOSIS — Z98891 History of uterine scar from previous surgery: Secondary | ICD-10-CM | POA: Diagnosis not present

## 2019-07-15 DIAGNOSIS — N83201 Unspecified ovarian cyst, right side: Secondary | ICD-10-CM | POA: Diagnosis not present

## 2019-07-15 DIAGNOSIS — Z9049 Acquired absence of other specified parts of digestive tract: Secondary | ICD-10-CM | POA: Insufficient documentation

## 2019-07-15 DIAGNOSIS — Z8041 Family history of malignant neoplasm of ovary: Secondary | ICD-10-CM | POA: Diagnosis not present

## 2019-07-15 DIAGNOSIS — D259 Leiomyoma of uterus, unspecified: Secondary | ICD-10-CM | POA: Insufficient documentation

## 2019-07-15 DIAGNOSIS — Z79899 Other long term (current) drug therapy: Secondary | ICD-10-CM | POA: Diagnosis not present

## 2019-07-15 HISTORY — PX: ROBOTIC ASSISTED TOTAL HYSTERECTOMY WITH BILATERAL SALPINGO OOPHERECTOMY: SHX6086

## 2019-07-15 HISTORY — PX: LYSIS OF ADHESION: SHX5961

## 2019-07-15 LAB — TYPE AND SCREEN
ABO/RH(D): O POS
Antibody Screen: NEGATIVE

## 2019-07-15 SURGERY — HYSTERECTOMY, TOTAL, ROBOT-ASSISTED, LAPAROSCOPIC, WITH BILATERAL SALPINGO-OOPHORECTOMY
Anesthesia: General

## 2019-07-15 MED ORDER — KETOROLAC TROMETHAMINE 30 MG/ML IJ SOLN: 30.0000 mg | Freq: Once | INTRAMUSCULAR | Status: AC | PRN

## 2019-07-15 MED ORDER — FENTANYL CITRATE (PF) 250 MCG/5ML IJ SOLN
INTRAMUSCULAR | Status: AC
  Filled 2019-07-15: qty 5

## 2019-07-15 MED ORDER — KETOROLAC TROMETHAMINE 30 MG/ML IJ SOLN
INTRAMUSCULAR | Status: AC
  Administered 2019-07-15: 30 mg via INTRAVENOUS
  Filled 2019-07-15: qty 1

## 2019-07-15 MED ORDER — ACETAMINOPHEN 500 MG PO TABS
1000.0000 mg | ORAL_TABLET | ORAL | Status: DC
  Filled 2019-07-15: qty 2

## 2019-07-15 MED ORDER — ESTRADIOL 0.025 MG/24HR TD PTTW: 1.0000 | MEDICATED_PATCH | TRANSDERMAL | 12 refills | Status: AC

## 2019-07-15 MED ORDER — ONDANSETRON HCL 4 MG/2ML IJ SOLN
INTRAMUSCULAR | Status: AC
  Filled 2019-07-15: qty 2

## 2019-07-15 MED ORDER — LIDOCAINE HCL 2 % IJ SOLN
INTRAMUSCULAR | Status: AC
  Filled 2019-07-15: qty 20

## 2019-07-15 MED ORDER — LIDOCAINE HCL (CARDIAC) PF 100 MG/5ML IV SOSY
PREFILLED_SYRINGE | INTRAVENOUS | Status: DC | PRN
  Administered 2019-07-15: 80 mg via INTRAVENOUS

## 2019-07-15 MED ORDER — BUPIVACAINE HCL 0.25 % IJ SOLN
INTRAMUSCULAR | Status: AC
  Filled 2019-07-15: qty 1

## 2019-07-15 MED ORDER — HYDROMORPHONE HCL 1 MG/ML IJ SOLN
0.2500 mg | INTRAMUSCULAR | Status: DC | PRN
  Administered 2019-07-15: 0.5 mg via INTRAVENOUS

## 2019-07-15 MED ORDER — LIDOCAINE 2% (20 MG/ML) 5 ML SYRINGE
INTRAMUSCULAR | Status: AC
  Filled 2019-07-15: qty 5

## 2019-07-15 MED ORDER — PROPOFOL 10 MG/ML IV BOLUS
INTRAVENOUS | Status: AC
  Filled 2019-07-15: qty 20

## 2019-07-15 MED ORDER — LIDOCAINE 20MG/ML (2%) 15 ML SYRINGE OPTIME
INTRAMUSCULAR | Status: DC | PRN
  Administered 2019-07-15: 1.5 mg/kg/h via INTRAVENOUS

## 2019-07-15 MED ORDER — ROCURONIUM BROMIDE 100 MG/10ML IV SOLN
INTRAVENOUS | Status: DC | PRN
  Administered 2019-07-15: 10 mg via INTRAVENOUS
  Administered 2019-07-15: 50 mg via INTRAVENOUS
  Administered 2019-07-15: 20 mg via INTRAVENOUS

## 2019-07-15 MED ORDER — OXYCODONE HCL 5 MG PO TABS: 5.0000 mg | ORAL_TABLET | Freq: Once | ORAL | Status: DC | PRN

## 2019-07-15 MED ORDER — ENSURE PRE-SURGERY PO LIQD
592.0000 mL | Freq: Once | ORAL | Status: DC
  Filled 2019-07-15: qty 592

## 2019-07-15 MED ORDER — GABAPENTIN 300 MG PO CAPS
300.0000 mg | ORAL_CAPSULE | ORAL | Status: AC
  Administered 2019-07-15: 06:00:00 300 mg via ORAL
  Filled 2019-07-15: qty 1

## 2019-07-15 MED ORDER — ACETAMINOPHEN 650 MG RE SUPP: 650.0000 mg | RECTAL | Status: DC | PRN

## 2019-07-15 MED ORDER — SUGAMMADEX SODIUM 200 MG/2ML IV SOLN
INTRAVENOUS | Status: DC | PRN
  Administered 2019-07-15: 200 mg via INTRAVENOUS

## 2019-07-15 MED ORDER — ACETAMINOPHEN 500 MG PO TABS
1000.0000 mg | ORAL_TABLET | Freq: Once | ORAL | Status: AC
  Administered 2019-07-15: 1000 mg via ORAL

## 2019-07-15 MED ORDER — HYDROMORPHONE HCL 1 MG/ML IJ SOLN
INTRAMUSCULAR | Status: AC
  Filled 2019-07-15: qty 1

## 2019-07-15 MED ORDER — PROPOFOL 10 MG/ML IV BOLUS
INTRAVENOUS | Status: DC | PRN
  Administered 2019-07-15: 160 mg via INTRAVENOUS

## 2019-07-15 MED ORDER — KETAMINE HCL 10 MG/ML IJ SOLN
INTRAMUSCULAR | Status: AC
  Filled 2019-07-15: qty 1

## 2019-07-15 MED ORDER — CELECOXIB 200 MG PO CAPS
400.0000 mg | ORAL_CAPSULE | ORAL | Status: AC
  Administered 2019-07-15: 400 mg via ORAL
  Filled 2019-07-15: qty 2

## 2019-07-15 MED ORDER — MIDAZOLAM HCL 2 MG/2ML IJ SOLN
INTRAMUSCULAR | Status: AC
  Filled 2019-07-15: qty 2

## 2019-07-15 MED ORDER — KETAMINE HCL 10 MG/ML IJ SOLN
INTRAMUSCULAR | Status: DC | PRN
  Administered 2019-07-15: 30 mg via INTRAVENOUS

## 2019-07-15 MED ORDER — ONDANSETRON HCL 4 MG/2ML IJ SOLN
INTRAMUSCULAR | Status: DC | PRN
  Administered 2019-07-15: 4 mg via INTRAVENOUS

## 2019-07-15 MED ORDER — SCOPOLAMINE 1 MG/3DAYS TD PT72: 1.0000 | MEDICATED_PATCH | Freq: Once | TRANSDERMAL | Status: DC

## 2019-07-15 MED ORDER — DEXAMETHASONE SODIUM PHOSPHATE 4 MG/ML IJ SOLN
4.0000 mg | INTRAMUSCULAR | Status: AC
  Administered 2019-07-15: 8 mg via INTRAVENOUS

## 2019-07-15 MED ORDER — SODIUM CHLORIDE 0.9% FLUSH: 3.0000 mL | INTRAVENOUS | Status: DC | PRN

## 2019-07-15 MED ORDER — SCOPOLAMINE 1 MG/3DAYS TD PT72
1.0000 | MEDICATED_PATCH | TRANSDERMAL | Status: DC
  Administered 2019-07-15: 1.5 mg via TRANSDERMAL
  Filled 2019-07-15: qty 1

## 2019-07-15 MED ORDER — DEXAMETHASONE SODIUM PHOSPHATE 10 MG/ML IJ SOLN
INTRAMUSCULAR | Status: AC
  Filled 2019-07-15: qty 1

## 2019-07-15 MED ORDER — ENSURE PRE-SURGERY PO LIQD
296.0000 mL | Freq: Once | ORAL | Status: DC
  Filled 2019-07-15: qty 296

## 2019-07-15 MED ORDER — SODIUM CHLORIDE 0.9 % IV SOLN
2.0000 g | Freq: Once | INTRAVENOUS | Status: DC
  Filled 2019-07-15: qty 2

## 2019-07-15 MED ORDER — ACETAMINOPHEN 325 MG PO TABS: 650.0000 mg | ORAL_TABLET | ORAL | Status: DC | PRN

## 2019-07-15 MED ORDER — SODIUM CHLORIDE 0.9% FLUSH: 3.0000 mL | Freq: Two times a day (BID) | INTRAVENOUS | Status: DC

## 2019-07-15 MED ORDER — MIDAZOLAM HCL 5 MG/5ML IJ SOLN
INTRAMUSCULAR | Status: DC | PRN
  Administered 2019-07-15: 2 mg via INTRAVENOUS

## 2019-07-15 MED ORDER — LACTATED RINGERS IV SOLN: INTRAVENOUS | Status: DC

## 2019-07-15 MED ORDER — ROCURONIUM BROMIDE 10 MG/ML (PF) SYRINGE
PREFILLED_SYRINGE | INTRAVENOUS | Status: AC
  Filled 2019-07-15: qty 10

## 2019-07-15 MED ORDER — FENTANYL CITRATE (PF) 250 MCG/5ML IJ SOLN
INTRAMUSCULAR | Status: DC | PRN
  Administered 2019-07-15: 50 ug via INTRAVENOUS
  Administered 2019-07-15 (×2): 100 ug via INTRAVENOUS

## 2019-07-15 MED ORDER — PROMETHAZINE HCL 25 MG/ML IJ SOLN: 6.2500 mg | INTRAMUSCULAR | Status: DC | PRN

## 2019-07-15 MED ORDER — OXYCODONE HCL 5 MG/5ML PO SOLN: 5.0000 mg | Freq: Once | ORAL | Status: DC | PRN

## 2019-07-15 MED ORDER — MORPHINE SULFATE (PF) 4 MG/ML IV SOLN: 2.0000 mg | INTRAVENOUS | Status: DC | PRN

## 2019-07-15 MED ORDER — BUPIVACAINE HCL 0.25 % IJ SOLN
INTRAMUSCULAR | Status: DC | PRN
  Administered 2019-07-15: 13 mL

## 2019-07-15 MED ORDER — SODIUM CHLORIDE 0.9 % IV SOLN: 250.0000 mL | INTRAVENOUS | Status: DC | PRN

## 2019-07-15 MED ORDER — OXYCODONE HCL 5 MG PO TABS: 5.0000 mg | ORAL_TABLET | ORAL | Status: DC | PRN

## 2019-07-15 MED ORDER — LACTATED RINGERS IR SOLN
Status: DC | PRN
  Administered 2019-07-15: 1

## 2019-07-15 SURGICAL SUPPLY — 64 items
APPLICATOR SURGIFLO ENDO (HEMOSTASIS) IMPLANT
BACTOSHIELD CHG 4% 4OZ (MISCELLANEOUS) ×1
BAG LAPAROSCOPIC 12 15 PORT 16 (BASKET) IMPLANT
BAG RETRIEVAL 12/15 (BASKET)
BLADE SURG SZ10 CARB STEEL (BLADE) IMPLANT
COVER BACK TABLE 60X90IN (DRAPES) ×3 IMPLANT
COVER TIP SHEARS 8 DVNC (MISCELLANEOUS) ×2 IMPLANT
COVER TIP SHEARS 8MM DA VINCI (MISCELLANEOUS) ×1
COVER WAND RF STERILE (DRAPES) IMPLANT
DECANTER SPIKE VIAL GLASS SM (MISCELLANEOUS) ×3 IMPLANT
DERMABOND ADVANCED (GAUZE/BANDAGES/DRESSINGS) ×1
DERMABOND ADVANCED .7 DNX12 (GAUZE/BANDAGES/DRESSINGS) ×2 IMPLANT
DRAPE ARM DVNC X/XI (DISPOSABLE) ×8 IMPLANT
DRAPE COLUMN DVNC XI (DISPOSABLE) ×2 IMPLANT
DRAPE DA VINCI XI ARM (DISPOSABLE) ×4
DRAPE DA VINCI XI COLUMN (DISPOSABLE) ×1
DRAPE SHEET LG 3/4 BI-LAMINATE (DRAPES) ×3 IMPLANT
DRAPE SURG IRRIG POUCH 19X23 (DRAPES) ×3 IMPLANT
DRSG OPSITE POSTOP 4X6 (GAUZE/BANDAGES/DRESSINGS) IMPLANT
DRSG OPSITE POSTOP 4X8 (GAUZE/BANDAGES/DRESSINGS) IMPLANT
ELECT REM PT RETURN 15FT ADLT (MISCELLANEOUS) ×3 IMPLANT
GLOVE BIO SURGEON STRL SZ 6 (GLOVE) ×12 IMPLANT
GLOVE BIO SURGEON STRL SZ 6.5 (GLOVE) ×6 IMPLANT
GOWN STRL REUS W/ TWL LRG LVL3 (GOWN DISPOSABLE) ×8 IMPLANT
GOWN STRL REUS W/TWL LRG LVL3 (GOWN DISPOSABLE) ×4
HOLDER FOLEY CATH W/STRAP (MISCELLANEOUS) ×3 IMPLANT
IRRIG SUCT STRYKERFLOW 2 WTIP (MISCELLANEOUS) ×3
IRRIGATION SUCT STRKRFLW 2 WTP (MISCELLANEOUS) ×2 IMPLANT
KIT PROCEDURE DA VINCI SI (MISCELLANEOUS)
KIT PROCEDURE DVNC SI (MISCELLANEOUS) IMPLANT
KIT TURNOVER KIT A (KITS) ×3 IMPLANT
MANIPULATOR UTERINE 4.5 ZUMI (MISCELLANEOUS) ×3 IMPLANT
NEEDLE HYPO 22GX1.5 SAFETY (NEEDLE) ×3 IMPLANT
NEEDLE SPNL 18GX3.5 QUINCKE PK (NEEDLE) IMPLANT
OBTURATOR OPTICAL STANDARD 8MM (TROCAR) ×1
OBTURATOR OPTICAL STND 8 DVNC (TROCAR) ×2
OBTURATOR OPTICALSTD 8 DVNC (TROCAR) ×2 IMPLANT
PACK ROBOT GYN CUSTOM WL (TRAY / TRAY PROCEDURE) ×3 IMPLANT
PAD POSITIONING PINK XL (MISCELLANEOUS) ×3 IMPLANT
PENCIL SMOKE EVACUATOR (MISCELLANEOUS) IMPLANT
PORT ACCESS TROCAR AIRSEAL 12 (TROCAR) ×2 IMPLANT
PORT ACCESS TROCAR AIRSEAL 5M (TROCAR) ×1
POUCH SPECIMEN RETRIEVAL 10MM (ENDOMECHANICALS) IMPLANT
SCRUB CHG 4% DYNA-HEX 4OZ (MISCELLANEOUS) ×2 IMPLANT
SEAL CANN UNIV 5-8 DVNC XI (MISCELLANEOUS) ×8 IMPLANT
SEAL XI 5MM-8MM UNIVERSAL (MISCELLANEOUS) ×4
SET TRI-LUMEN FLTR TB AIRSEAL (TUBING) ×3 IMPLANT
SPONGE LAP 18X18 RF (DISPOSABLE) IMPLANT
SURGIFLO W/THROMBIN 8M KIT (HEMOSTASIS) IMPLANT
SUT MNCRL AB 4-0 PS2 18 (SUTURE) IMPLANT
SUT PDS AB 1 TP1 96 (SUTURE) IMPLANT
SUT VIC AB 0 CT1 27 (SUTURE)
SUT VIC AB 0 CT1 27XBRD ANTBC (SUTURE) IMPLANT
SUT VIC AB 2-0 CT1 27 (SUTURE)
SUT VIC AB 2-0 CT1 TAPERPNT 27 (SUTURE) IMPLANT
SUT VICRYL 4-0 PS2 18IN ABS (SUTURE) ×6 IMPLANT
SYR 10ML LL (SYRINGE) IMPLANT
TOWEL OR NON WOVEN STRL DISP B (DISPOSABLE) ×3 IMPLANT
TRAP SPECIMEN MUCOUS 40CC (MISCELLANEOUS) IMPLANT
TRAY FOLEY MTR SLVR 16FR STAT (SET/KITS/TRAYS/PACK) ×3 IMPLANT
TROCAR XCEL NON-BLD 5MMX100MML (ENDOMECHANICALS) IMPLANT
UNDERPAD 30X36 HEAVY ABSORB (UNDERPADS AND DIAPERS) ×3 IMPLANT
WATER STERILE IRR 1000ML POUR (IV SOLUTION) ×3 IMPLANT
YANKAUER SUCT BULB TIP 10FT TU (MISCELLANEOUS) IMPLANT

## 2019-07-15 NOTE — Anesthesia Postprocedure Evaluation (Signed)
Anesthesia Post Note  Patient: Megan Gibson  Procedure(s) Performed: XI ROBOTIC ASSISTED TOTAL HYSTERECTOMY WITH BILATERAL SALPINGO OOPHORECTOMY (Bilateral ) LYSIS OF ADHESION (N/A )     Patient location during evaluation: PACU Anesthesia Type: General Level of consciousness: awake and alert and oriented Pain management: pain level controlled Vital Signs Assessment: post-procedure vital signs reviewed and stable Respiratory status: spontaneous breathing, nonlabored ventilation and respiratory function stable Cardiovascular status: blood pressure returned to baseline Postop Assessment: no apparent nausea or vomiting Anesthetic complications: no    Last Vitals:  Vitals:   07/15/19 1000 07/15/19 1015  BP: (!) 141/106 139/88  Pulse: 79 80  Resp: 14 11  Temp:  36.8 C  SpO2: 95% 100%    Last Pain:  Vitals:   07/15/19 1015  TempSrc:   PainSc: [place]

## 2019-07-15 NOTE — Discharge Instructions (Signed)
07/15/2019  Return to work: 4 weeks  Activity: 1. Be up and out of the bed during the day.  Take a nap if needed.  You may walk up steps but be careful and use the hand rail.  Stair climbing will tire you more than you think, you may need to stop part way and rest.   2. No lifting or straining for 6 weeks.  3. No driving for 1 weeks.  Do Not drive if you are taking narcotic pain medicine.  4. Shower daily.  Use soap and water on your incision and pat dry; don't rub.   5. No sexual activity and nothing in the vagina for 8 weeks.  Medications:  - Take ibuprofen and tylenol first line for pain control. Take these regularly (every 6 hours) to decrease the build up of pain.  - If necessary, for severe pain not relieved by ibuprofen, take percocet.  - While taking percocet you should take sennakot every night to reduce the likelihood of constipation. If this causes diarrhea, stop its use.  Diet: 1. Low sodium Heart Healthy Diet is recommended.  2. It is safe to use a laxative if you have difficulty moving your bowels.   Wound Care: 1. Keep clean and dry.  Shower daily.  Reasons to call the Doctor:   Fever - Oral temperature greater than 100.4 degrees Fahrenheit  Foul-smelling vaginal discharge  Difficulty urinating  Nausea and vomiting  Increased pain at the site of the incision that is unrelieved with pain medicine.  Difficulty breathing with or without chest pain  New calf pain especially if only on one side  Sudden, continuing increased vaginal bleeding with or without clots.   Follow-up: 1. See Everitt Amber in 3-4 weeks.  Contacts: For questions or concerns you should contact:  Dr. Everitt Amber at [phone] After hours and on week-ends call [phone] and ask to speak to the physician on call for Gynecologic Oncology  After Your Surgery  The information in this section will tell you what to expect after your surgery, both during your stay and after you leave.  You will learn how to safely recover from your surgery. Write down any questions you have and be sure to ask your doctor or nurse.  What to Expect When you wake up after your surgery, you will be in the [facility] (PACU) or your recovery room. A nurse will be monitoring your body temperature, blood pressure, pulse, and oxygen levels. You may have a urinary catheter in your bladder to help monitor the amount of urine you are making. It should come out before you go home. You will also have compression boots on your lower legs to help your circulation. Your pain medication will be given through an IV line or in tablet form. If you are having pain, tell your nurse. Your nurse will tell you how to recover from your surgery. Below are examples of ways you can help yourself recover safely. . You will be encouraged to walk with the help of your nurse or physical therapist. We will give you medication to relieve pain. Walking helps reduce the risk for blood clots and pneumonia. It also helps to stimulate your bowels so they begin working again. . Use your incentive spirometer. This will help your lungs expand, which prevents pneumonia.   Commonly Asked Questions  Will I have pain after surgery? Yes, you will have some pain after your surgery, especially in the first few days. Your  doctor and nurse will ask you about your pain often. You will be given medication to manage your pain as needed. If your pain is not relieved, please tell your doctor or nurse. It is important to control your pain so you can cough, breathe deeply, use your incentive spirometer, and get out of bed and walk.  Will I be able to eat? Yes, you will be able to eat a regular diet or eat as tolerated. You should start with foods that are soft and easy to digest such as apple sauce and chicken noodle soup. Eat small meals frequently, and then advance to regular foods. If you experience bloating, gas, or cramps, limit  high-fiber foods, including whole grain breads and cereal, nuts, seeds, salads, fresh fruit, broccoli, cabbage, and cauliflower. Will I have pain when I am home? The length of time each person has pain or discomfort varies. You may still have some pain when you go home and will probably be taking pain medication. Follow the guidelines below. . Take your medications as directed and as needed. . Call your doctor if the medication prescribed for you doesn't relieve your pain. . Don't drive or drink alcohol while you're taking prescription pain medication. . As your incision heals, you will have less pain and need less pain medication. A mild pain reliever such as acetaminophen (Tylenol) or ibuprofen (Advil) will relieve aches and discomfort. However, large quantities of acetaminophen may be harmful to your liver. Don't take more acetaminophen than the amount directed on the bottle or as instructed by your doctor or nurse. . Pain medication should help you as you resume your normal activities. Take enough medication to do your exercises comfortably. Pain medication is most effective 30 to 45 minutes after taking it. Marland Kitchen Keep track of when you take your pain medication. Taking it when your pain first begins is more effective than waiting for the pain to get worse. Pain medication may cause constipation (having fewer bowel movements than what is normal for you).  How can I prevent constipation? . Go to the bathroom at the same time every day. Your body will get used to going at that time. . If you feel the urge to go, don't put it off. Try to use the bathroom 5 to 15 minutes after meals. . After breakfast is a good time to move your bowels. The reflexes in your colon are strongest at this time. . Exercise, if you can. Walking is an excellent form of exercise. . Drink 8 (8-ounce) glasses (2 liters) of liquids daily, if you can. Drink water, juices, soups, ice cream shakes, and other drinks that don't have  caffeine. Drinks with caffeine, such as coffee and soda, pull fluid out of the body. . Slowly increase the fiber in your diet to 25 to 35 grams per day. Fruits, vegetables, whole grains, and cereals contain fiber. If you have an ostomy or have had recent bowel surgery, check with your doctor or nurse before making any changes in your diet. . Both over-the-counter and prescription medications are available to treat constipation. Start with 1 of the following over-the-counter medications first: o Docusate sodium (Colace) 100 mg. Take ___1__ capsules _2____ times a day. This is a stool softener that causes few side effects. Don't take it with mineral oil. o Polyethylene glycol (MiraLAX) 17 grams daily. o Senna (Senokot) 2 tablets at bedtime. This is a stimulant laxative, which can cause cramping. . If you haven't had a bowel movement in 2 days,  call your doctor or nurse.  Can I shower? Yes, you should shower 24 hours after your surgery. Be sure to shower every day. Taking a warm shower is relaxing and can help decrease muscle aches. Use soap when you shower and gently wash your incision. Pat the areas dry with a towel after showering, and leave your incision uncovered (unless there is drainage). Call your doctor if you see any redness or drainage from your incision. Don't take tub baths until you discuss it with your doctor at the first appointment after your surgery. How do I care for my incisions? You will have several small incisions on your abdomen. The incisions are closed with Steri-Strips or Dermabond. You may also have square white dressings on your incisions (Prim[place]). You can remove these in the shower 24 hours after your surgery. You should clean your incisions with soap and water. If you go home with Steri-Strips on your incision, they will loosen and may fall off by themselves. If they haven't fallen off within 10 days, you can remove them. If you go home with Dermabond over your  sutures (stitches), it will also loosen and peel off.  What are the most common symptoms after a hysterectomy? It's common for you to have some vaginal spotting or light bleeding. You should monitor this with a pad or a panty liner. If you have having heavy bleeding (bleeding through a pad or liner every 1 to 2 hours), call your doctor right away. It's also common to have some discomfort after surgery from the air that was pumped into your abdomen during surgery. To help with this, walk, drink plenty of liquids and make sure to take the stool softeners you received.  When is it safe for me to drive? You may resume driving 2 weeks after surgery, as long as you are not taking pain medication that may make you drowsy.  When can I resume sexual activity? Do not place anything in your vagina or have vaginal intercourse for 8 weeks after your surgery. Some people will need to wait longer than 8 weeks, so speak with your doctor before resuming sexual intercourse.  Will I be able to travel? Yes, you can travel. If you are traveling by plane within a few weeks after your surgery, make sure you get up and walk every hour. Be sure to stretch your legs, drink plenty of liquids, and keep your feet elevated when possible.  Will I need any supplies? Most people do not need any supplies after the surgery. In the rare case that you do need supplies, such as tubes or drains, your nurse will order them for you.  When can I return to work? The time it takes to return to work depends on the type of work you do, the type of surgery you had, and how fast your body heals. Most people can return to work about 2 to 4 weeks after the surgery.  What exercises can I do? Exercise will help you gain strength and feel better. Walking and stair climbing are excellent forms of exercise. Gradually increase the distance you walk. Climb stairs slowly, resting or stopping as needed. Ask your doctor or nurse before starting more  strenuous exercises.  When can I lift heavy objects? Most people should not lift anything heavier than 10 pounds (4.5 kilograms) for at least 4 weeks after surgery. Speak with your doctor about when you can do heavy lifting.  How can I cope with my feelings? After surgery for  a serious illness, you may have new and upsetting feelings. Many people say they felt weepy, sad, worried, nervous, irritable, and angry at one time or another. You may find that you can't control some of these feelings. If this happens, it's a good idea to seek emotional support. The first step in coping is to talk about how you feel. Family and friends can help. Your nurse, doctor, and social worker can reassure, support, and guide you. It's always a good idea to let these professionals know how you, your family, and your friends are feeling emotionally. Many resources are available to patients and their families. Whether you're in the hospital or at home, the nurses, doctors, and social workers are here to help you and your family and friends handle the emotional aspects of your illness.  When is my first appointment after surgery? Your first appointment after surgery will be 2 to 4 weeks after surgery. Your nurse will give you instructions on how to make this appointment, including the phone number to call.  What if I have other questions? If you have any questions or concerns, please talk with your doctor or nurse. You can reach them Monday through Friday from 9:00 am to 5:00 pm. After 5:00 pm, during the weekend, and on holidays, call [phone] and ask for the doctor on call for your doctor.  . Have a temperature of 101 F (38.3 C) or higher . Have pain that does not get better with pain medication . Have redness, drainage, or swelling from your incisions

## 2019-07-15 NOTE — Anesthesia Procedure Notes (Signed)
Procedure Name: Intubation Date/Time: 07/15/2019 7:38 AM Performed by: Glory Buff, CRNA Pre-anesthesia Checklist: Patient identified, Emergency Drugs available, Suction available and Patient being monitored Patient Re-evaluated:Patient Re-evaluated prior to induction Oxygen Delivery Method: Circle system utilized Preoxygenation: Pre-oxygenation with 100% oxygen Induction Type: IV induction Ventilation: Mask ventilation without difficulty Laryngoscope Size: Miller and 3 Grade View: Grade I Tube type: Oral Tube size: 7.0 mm Number of attempts: 1 Airway Equipment and Method: Stylet and Oral airway Placement Confirmation: ETT inserted through vocal cords under direct vision,  positive ETCO2 and breath sounds checked- equal and bilateral Secured at: 21 cm Tube secured with: Tape Dental Injury: Teeth and Oropharynx as per pre-operative assessment

## 2019-07-15 NOTE — Transfer of Care (Signed)
Immediate Anesthesia Transfer of Care Note  Patient: Megan Gibson  Procedure(s) Performed: XI ROBOTIC ASSISTED TOTAL HYSTERECTOMY WITH BILATERAL SALPINGO OOPHORECTOMY (Bilateral ) LYSIS OF ADHESION (N/A )  Patient Location: PACU  Anesthesia Type:General  Level of Consciousness: drowsy, patient cooperative and responds to stimulation  Airway & Oxygen Therapy: Patient Spontanous Breathing and Patient connected to face mask oxygen  Post-op Assessment: Report given to RN and Post -op Vital signs reviewed and stable  Post vital signs: Reviewed and stable  Last Vitals:  Vitals Value Taken Time  BP    Temp    Pulse 80 07/15/19 0932  Resp 18 07/15/19 0932  SpO2 100 % 07/15/19 0932  Vitals shown include unvalidated device data.  Last Pain:  Vitals:   07/15/19 0550  TempSrc:   PainSc: 0-No pain         Complications: No apparent anesthesia complications

## 2019-07-15 NOTE — Op Note (Signed)
OPERATIVE NOTE 07/15/19  Surgeon: Donaciano Eva   Assistants: Dr Lahoma Crocker (an MD assistant was necessary for tissue manipulation, management of robotic instrumentation, retraction and positioning due to the complexity of the case and hospital policies).   Anesthesia: General endotracheal anesthesia  ASA Class: 2, 3   Pre-operative Diagnosis: pelvic inflammatory disease, tubo-ovarian abscesses  Post-operative Diagnosis: same and pelvic adhesions  Operation: Robotic-assisted laparoscopic total hysterectomy with bilateral salpingoophorectomy, lysis of adhesions.   Surgeon: Donaciano Eva  Assistant Surgeon: Lahoma Crocker MD  Anesthesia: GET  Urine Output: 100cc  Operative Findings:  : omental adhesions to a fascial defect in her prior low transverse incision. Normal appendix. Adhesions between sigmoid colon and posterior uterus. Bladder adherent to anterior lower uterine segment. Left pelvic pseudocyst. Grossly normal ovaries. Fibroid 12cm uterus.  Estimated Blood Loss:  20cc      Total IV Fluids: 800 ml         Specimens: uterus, bilateral tubes and ovaries.         Complications:  None; patient tolerated the procedure well.         Disposition: PACU - hemodynamically stable.  Procedure Details  The patient was seen in the Holding Room. The risks, benefits, complications, treatment options, and expected outcomes were discussed with the patient.  The patient concurred with the proposed plan, giving informed consent.  The site of surgery properly noted/marked. The patient was identified as Megan Gibson  and the procedure verified as a Robotic-assisted hysterectomy with bilateral salpingo oophorectomy. A Time Out was held and the above information confirmed.  After induction of anesthesia, the patient was draped and prepped in the usual sterile manner. Pt was placed in supine position after anesthesia and draped and prepped in the usual sterile manner. The  abdominal drape was placed after the CholoraPrep had been allowed to dry for 3 minutes.  Her arms were tucked to her side with all appropriate precautions.  The shoulders were stabilized with padded shoulder blocks applied to the acromium processes.  The patient was placed in the semi-lithotomy position in [place].  The perineum was prepped with Betadine. The patient was then prepped. Foley catheter was placed.  A sterile speculum was placed in the vagina.  The cervix was grasped with a single-tooth tenaculum and dilated with Kennon Rounds dilators.  The ZUMI uterine manipulator with a medium colpotomizer ring was placed without difficulty.  A pneum occluder balloon was placed over the manipulator.  OG tube placement was confirmed and to suction.   Next, a 5 mm skin incision was made 1 cm below the subcostal margin in the midclavicular line.  The 5 mm Optiview port and scope was used for direct entry.  Opening pressure was under 10 mm CO2.  The abdomen was insufflated and the findings were noted as above.   At this point and all points during the procedure, the patient's intra-abdominal pressure did not exceed 15 mmHg. Next, a 10 mm skin incision was made in the umbilicus and a right and left port was placed about 10 cm lateral to the robot port on the right and left side.   All ports were placed under direct visualization.  The patient was placed in steep Trendelenburg.  Bowel was folded away into the upper abdomen.  The robot was docked in the normal manner.  For 20 minutes adhesiolysis was performed in the pelvis to separate the omental adhesions from the lower pelvic abdominal wall delivering the omentum from the fascial  defect. The sigmoid colon adhesions were sharply dissected from the uterus ensuring no injury to the colonic wall.   The hysterectomy was started after the round ligament on the right side was incised and the retroperitoneum was entered and the pararectal space was developed.  The ureter  was noted to be on the medial leaf of the broad ligament.  The peritoneum above the ureter was incised and stretched and the infundibulopelvic ligament was skeletonized, cauterized and cut.  The posterior peritoneum was taken down to the level of the KOH ring.  The anterior peritoneum was also taken down.  The bladder flap was created to the level of the KOH ring.  The uterine artery on the right side was skeletonized, cauterized and cut in the normal manner.  A similar procedure was performed on the left.  The colpotomy was made and the uterus, cervix, bilateral ovaries and tubes were amputated and delivered through the vagina.  Pedicles were inspected and excellent hemostasis was achieved.    The colpotomy at the vaginal cuff was closed with Vicryl on a CT1 needle in a running manner.  Copious irrigation was used and excellent hemostasis was achieved.  At this point in the procedure was completed.  Robotic instruments were removed under direct visulaization.  The robot was undocked. The 10 mm ports were closed with Vicryl on a UR-5 needle and the fascia was closed with 0 Vicryl on a UR-5 needle.  The skin was closed with 4-0 Vicryl in a subcuticular manner.  Dermabond was applied.  Sponge, lap and needle counts correct x 2.  The patient was taken to the recovery room in stable condition.  The vagina was swabbed with  minimal bleeding noted.   All instrument and needle counts were correct x  3.   The patient was transferred to the recovery room in a stable condition.  Donaciano Eva, MD

## 2019-07-15 NOTE — H&P (Signed)
H&P Note: Gyn-Onc  Consult was requested by Dr. Nelva Bush for the evaluation of Megan Gibson 49 y.o. female  CC:  Tubo-ovarian abscesses and pelvic inflammatory disease.  Assessment/Plan:  [name]  is a 49 y.o.  year old with a history of PID and bilateral adnexal masses (Lt greater than Rt).  I discussed that total hysterectomy with BSO is a good option to decrease the likelihood of recurrent PID given that she is already experienced 1 flareup of this after initial diagnosis.  We are performing surgery several months after her most recent bout of PID as there is an exceptionally high risk for complications and even postoperative sepsis or mortality when operating the setting of acute pelvic inflammatory disease and infection.  I spent time speaking with Megan Gibson about the procedure.  I expect a robotic assisted procedure will be feasible.  It will require likely extensive lysis of adhesions.  There is a potential for colonic surgery, or urologic surgery necessary.  She will have a bowel prep with antibiotics preoperatively to comply with the colon bundle protocol.  I counseled her regarding the major serious complications that can be associated with hysterectomy for pelvic inflammatory disease including but not limited to surgical site infection risks which are particularly high particular if laparotomy is performed, visceral injury particular to GI or GU structures, VTE, sepsis, and death.  HPI: [name] is a 49 year old P2 who is seen in consultation at the request of Dr. Nelva Bush for evaluation of pelvic inflammatory disease and bilateral tubo-ovarian abscesses.  The patient initially had a bout of pelvic inflammatory disease in August 2019.  This was felt to be subsequent to an episode of diverticulitis and at the time was felt to be diverticulitis and not pelvic inflammatory disease.  In October 2020 she began experiencing left-sided pelvic bloating and pain.  She denied  fever.  She was evaluated seen and admitted to [facility] with a diagnosis of tubo-ovarian abscesses and pelvic inflammatory disease.  She was treated with IV antibiotics over 48 hours.  Then discharged home on 2-week course of oral antibiotics including Flagyl and possibly Augmentin.  During that admission a CT scan of the abdomen and pelvis was performed on March 14, 2019.  This revealed small volume pelvic ascites with pelvic mesenteric stranding correlate for PID.  Prior secondary inflammatory changes involving the sigmoid colon were not evident on the current study.  A transvaginal ultrasound was performed the following day on March 15, 2019.  This revealed a uterus measuring 8.9 x 5.2 x 6.1 cm with no fibroids or other masses visualized.  The endometrium was 10 mm.  The right ovary measured 3 x 1.7 x 1.7 cm and contained a 2.8 cm complex cystic area.  The left ovary measured 3.5 x 2 x 2.7 cm and contained multiple complex cystic areas in the adnexa including a 4.5 x 2.9 x 2.2 cm complex area lateral to the left ovary and a 3.7 x 4 x 2.8 cm area medial to the left ovary.  She subsequently felt much better and at the time of her initial consultation with me had no symptoms concerning for PAD.  A Ca1 25 had been ordered in the summer 2020 (10/28/2018) and was normal at 8.  The patient is otherwise a fairly healthy woman.  She has a history of a cholecystectomy, 2 prior cesarean sections, and no vaginal deliveries.   She has had a prior endometrial ablation and tubal  ligation.    Her mother had 3 cancers including breast, ovarian, and colon cancer.  She ultimately died from colon cancer.  She was never genetically tested.  The patient herself has had genetic consultation performed here at [facility] and this was negative for deleterious mutations.  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  She lives with her husband and 2 children age 51 and 94.  Interval Hx:  Repeat CT scan in February, 2021 showed improvement in the complex fluid collections in the pelvis.   Current Meds:  Outpatient Encounter Medications as of 04/20/2019  Medication Sig  . acetaminophen (TYLENOL) 500 MG tablet Take 2 tablets (1,000 mg total) by mouth every 6 (six) hours as needed for mild pain, moderate pain or fever (or Fever >/= 101). (Patient not taking: Reported on 03/15/2019)  . fexofenadine (ALLEGRA) 180 MG tablet Take 180 mg by mouth at bedtime.   . fluticasone (FLONASE) 50 MCG/ACT nasal spray One spray nostril daily for 2 weeks then stop   Give generic (Patient taking differently: Place 1 spray into both nostrils daily as needed for allergies or rhinitis. )  . ibuprofen (ADVIL) 800 MG tablet Take 1 tablet (800 mg total) by mouth every 8 (eight) hours as needed for moderate pain. For AFTER surgery  . oxyCODONE (OXY IR/ROXICODONE) 5 MG immediate release tablet Take 1 tablet (5 mg total) by mouth every 4 (four) hours as needed for severe pain. For AFTER surgery, do not take and drive  . pseudoephedrine (SUDAFED) 30 MG tablet Take 30 mg by mouth every 4 (four) hours as needed for congestion.  . Psyllium (METAMUCIL PO) Take 1 Scoop by mouth daily as needed (constipation).  Marland Kitchen senna-docusate (SENOKOT-S) 8.6-50 MG tablet Take 2 tablets by mouth at bedtime. For AFTER surgery, do not take if having diarrhea  . [DISCONTINUED] ibuprofen (ADVIL) 800 MG tablet Take 1 tablet (800 mg total) by mouth 3 (three) times daily with meals as needed for headache, mild pain, moderate pain or cramping.  . [DISCONTINUED] senna-docusate (SENOKOT-S) 8.6-50 MG tablet Take 1 tablet by mouth 2 (two) times daily as needed for mild constipation or moderate constipation.   No facility-administered encounter medications on file as of 04/20/2019.     Allergy:  Allergies  Allergen Reactions  . Bee Venom Anaphylaxis    Social Hx:   Social History   Socioeconomic History  . Marital status: Married     Spouse name: Not on file  . Number of children: 1  . Years of education: Not on file  . Highest education level: Not on file  Occupational History  . Not on file  Tobacco Use  . Smoking status: Never Smoker  . Smokeless tobacco: Never Used  Substance and Sexual Activity  . Alcohol use: Yes    Alcohol/week: 0.0 standard drinks    Comment: 5/month  . Drug use: No  . Sexual activity: Yes    Partners: Male    Birth control/protection: Surgical  Other Topics Concern  . Not on file  Social History Narrative  . Not on file   Social Determinants of Health   Financial Resource Strain:   . Difficulty of Paying Living Expenses: Not on file  Food Insecurity:   . Worried About Charity fundraiser in the Last Year: Not on file  . Ran Out of Food in the Last Year: Not on file  Transportation Needs:   . Lack of Transportation (Medical): Not on  file  . Lack of Transportation (Non-Medical): Not on file  Physical Activity:   . Days of Exercise per Week: Not on file  . Minutes of Exercise per Session: Not on file  Stress:   . Feeling of Stress : Not on file  Social Connections:   . Frequency of Communication with Friends and Family: Not on file  . Frequency of Social Gatherings with Friends and Family: Not on file  . Attends Religious Services: Not on file  . Active Member of Clubs or Organizations: Not on file  . Attends Archivist Meetings: Not on file  . Marital Status: Not on file  Intimate Partner Violence:   . Fear of Current or Ex-Partner: Not on file  . Emotionally Abused: Not on file  . Physically Abused: Not on file  . Sexually Abused: Not on file    Past Surgical Hx:  Past Surgical History:  Procedure Laterality Date  . ABLATION  11/2011  . CESAREAN SECTION     x2  . CHOLECYSTECTOMY    . COLONOSCOPY  11/19/2018  . TUBAL LIGATION      Past Medical Hx:  Past Medical History:  Diagnosis Date  . Adnexal mass    Bilateral  . Anemia   . Anxiety   .  Family history of breast cancer in mother   . Family history of breast cancer in mother   . Family history of colon cancer in mother   . Family history of ovarian cancer   . Family history of pancreatic cancer   . History of abnormal cervical Pap smear   . History of diverticulitis   . PID (pelvic inflammatory disease)   . Seasonal allergies     Past Gynecological History:  See HPI Patient's last menstrual period was 06/28/2019 (approximate).  Family Hx:  Family History  Problem Relation Age of Onset  . Breast cancer Mother        dx in her 85s  . Ovarian cancer Mother 12  . Colon cancer Mother 56  . Skin cancer Mother   . Heart disease Father   . Diabetes Father   . High blood pressure Father   . Heart attack Father   . Diabetes type II Father   . Diabetes Maternal Grandmother   . Heart disease Maternal Grandmother   . Pancreatic cancer Maternal Grandfather        dx and died in his 68s  . Ovarian cancer Maternal Aunt        dx in her 19s  . Lung cancer Maternal Uncle   . Multiple sclerosis Maternal Aunt   . Prostate cancer Maternal Uncle        metastized to liver  . Breast cancer Cousin        maternal first cousin dx in her 53s  . Cancer Cousin        maternal first cousin dx with cancer NOS, causing her to have a TAH-BSO in her 54s    Review of Systems:  Constitutional  Feels well,    ENT Normal appearing ears and nares bilaterally Skin/Breast  No rash, sores, jaundice, itching, dryness Cardiovascular  No chest pain, shortness of breath, or edema  Pulmonary  No cough or wheeze.  Gastro Intestinal  No nausea, vomitting, or diarrhoea. No bright red blood per rectum, no abdominal pain, change in bowel movement, or constipation.  Genito Urinary  No frequency, urgency, dysuria, no pain Musculo Skeletal  No myalgia, arthralgia, joint swelling  or pain  Neurologic  No weakness, numbness, change in gait,  Psychology  No depression, anxiety, insomnia.    Vitals:  Blood pressure 135/89, pulse 88, temperature 97.8 F (36.6 C), temperature source Oral, resp. rate 14, weight 179 lb 3.7 oz (81.3 kg), last menstrual period 06/28/2019, SpO2 100 %.  Physical Exam: WD in NAD Neck  Supple NROM, without any enlargements.  Lymph Node Survey No cervical supraclavicular or inguinal adenopathy Cardiovascular  Pulse normal rate, regularity and rhythm. S1 and S2 normal.  Lungs  Clear to auscultation bilateraly, without wheezes/crackles/rhonchi. Good air movement.  Skin  No rash/lesions/breakdown  Psychiatry  Alert and oriented to person, place, and time  Abdomen  Normoactive bowel sounds, abdomen soft, non-tender and non obese without evidence of hernia.  Back No CVA tenderness Genito Urinary  Vulva/vagina: Normal external female genitalia.  No lesions. No discharge or bleeding.  Bladder/urethra:  No lesions or masses, well supported bladder  Vagina: normal  Cervix: Normal appearing, no lesions.  Uterus:  Small, mobile, no parametrial involvement or nodularity.  Adnexa: no discretely palpable masses. Rectal  Good tone, no masses no cul de sac nodularity.  Extremities  No bilateral cyanosis, clubbing or edema.   Thereasa Solo, MD  07/15/2019, 6:59 AM

## 2019-07-16 ENCOUNTER — Telehealth: Payer: Self-pay | Admitting: *Deleted

## 2019-07-16 LAB — SURGICAL PATHOLOGY

## 2019-07-16 NOTE — Telephone Encounter (Signed)
Pt notified that surgical pathology is benign.

## 2019-07-16 NOTE — Telephone Encounter (Signed)
Pt states that she is feeling well overall, but  "sore" this morning. Rates pain 3.5/10 - taking prescribed pain meds as directed. Pt passing gas, had a BM, but was "like diarrhea". Pt encouraged to drink 64 oz of fluid today and can follow a regular diet. Pt up ambulating and denies fever, chills or drainage from her incisions. Pt is aware of future scheduled appointments.Clinic phone number given to pt so she can call with any questions or concerns.

## 2019-08-03 ENCOUNTER — Other Ambulatory Visit: Payer: Self-pay

## 2019-08-03 ENCOUNTER — Inpatient Hospital Stay: Payer: BLUE CROSS/BLUE SHIELD | Attending: Gynecologic Oncology | Admitting: Gynecologic Oncology

## 2019-08-03 ENCOUNTER — Encounter: Payer: Self-pay | Admitting: Gynecologic Oncology

## 2019-08-03 VITALS — BP 124/73 | HR 103 | Temp 98.7°F | Resp 18 | Ht 63.0 in | Wt 181.4 lb

## 2019-08-03 DIAGNOSIS — N7093 Salpingitis and oophoritis, unspecified: Secondary | ICD-10-CM | POA: Insufficient documentation

## 2019-08-03 DIAGNOSIS — N730 Acute parametritis and pelvic cellulitis: Secondary | ICD-10-CM | POA: Diagnosis not present

## 2019-08-03 DIAGNOSIS — Z90710 Acquired absence of both cervix and uterus: Secondary | ICD-10-CM | POA: Insufficient documentation

## 2019-08-03 DIAGNOSIS — N736 Female pelvic peritoneal adhesions (postinfective): Secondary | ICD-10-CM | POA: Diagnosis not present

## 2019-08-03 DIAGNOSIS — N83202 Unspecified ovarian cyst, left side: Secondary | ICD-10-CM | POA: Diagnosis not present

## 2019-08-03 DIAGNOSIS — N83201 Unspecified ovarian cyst, right side: Secondary | ICD-10-CM | POA: Insufficient documentation

## 2019-08-03 DIAGNOSIS — Z90722 Acquired absence of ovaries, bilateral: Secondary | ICD-10-CM | POA: Insufficient documentation

## 2019-08-03 NOTE — Progress Notes (Signed)
Follow-up Note: Gyn-Onc  Consult was initially requested by Dr. Nelva Bush for the evaluation of Megan Gibson 49 y.o. female  CC:  Chief Complaint  Patient presents with  . PID (acute pelvic inflammatory disease)  . TOA (tubo-ovarian abscess)  . Pelvic adhesions    Assessment/Plan:  Megan Gibson  is a 49 y.o.  year old with a history of PID and bilateral adnexal masses who is 3 weeks s/p robotic assisted TLH, BSO on July 15, 2019.  Surgery was uncomplicated and she has done well postoperatively with no major complications or issues.  She has no pain or vaginal bleeding.  She is healing well from surgery.  She is tolerating estradiol estrogen replacement therapy well.  She will follow-up with Dr. Earleen Newport for ongoing hormone replacement therapy prescription in addition to annual vaginal Paps that she has a history of an abnormal Pap test approximately 3 to 4 years ago.  I explained that she will continue to need annual vaginal cytologic assessment for 20 years after her last abnormal Pap in accordance with ASCCP guidelines.  Interval Hx:  She underwent a 28-month period of surveillance to allow cooling off of her pelvic inflammatory disease.  On July 15, 2019 she underwent a robotic assisted total hysterectomy with bilateral salpingo-oophorectomy and lysis of adhesions.  Intraoperative findings were significant for omental adhesions to a fascial defect in her prior low transverse incision, normal-appearing appendix.  Adhesions between the sigmoid colon and posterior uterus.  The bladder was adherent to the anterior lower uterine segment.  There was a left pelvic pseudocyst which was evacuated.  There were grossly normal ovaries bilaterally and a 12 cm fibroid uterus.  Final pathology confirmed benign uterus with fibrous adhesions on the serosa, benign cervix without dysplasia or malignancy, bilateral ovaries that contain inclusion cysts and no malignancy, focal chronic inflammation  in bilateral fallopian tubes consistent with her history of prior pelvic inflammatory disease.  Postoperatively she did well with no specific complaints.  HPI: Megan Gibson is a 49 year old P2 who is seen in consultation at the request of Dr. Nelva Bush for evaluation of pelvic inflammatory disease and bilateral tubo-ovarian abscesses.  The patient initially had a bout of pelvic inflammatory disease in August 2019.  This was felt to be subsequent to an episode of diverticulitis and at the time was felt to be diverticulitis and not pelvic inflammatory disease.  In October 2020 she began experiencing left-sided pelvic bloating and pain.  She denied fever.  She was evaluated seen and admitted to [facility] with a diagnosis of tubo-ovarian abscesses and pelvic inflammatory disease.  She was treated with IV antibiotics over 48 hours.  Then discharged home on 2-week course of oral antibiotics including Flagyl and possibly Augmentin.  During that admission a CT scan of the abdomen and pelvis was performed on March 14, 2019.  This revealed small volume pelvic ascites with pelvic mesenteric stranding correlate for PID.  Prior secondary inflammatory changes involving the sigmoid colon were not evident on the current study.  A transvaginal ultrasound was performed the following day on March 15, 2019.  This revealed a uterus measuring 8.9 x 5.2 x 6.1 cm with no fibroids or other masses visualized.  The endometrium was 10 mm.  The right ovary measured 3 x 1.7 x 1.7 cm and contained a 2.8 cm complex cystic area.  The left ovary measured 3.5 x 2 x 2.7 cm and contained multiple complex cystic areas in the adnexa  including a 4.5 x 2.9 x 2.2 cm complex area lateral to the left ovary and a 3.7 x 4 x 2.8 cm area medial to the left ovary.  She subsequently felt much better and at the time of her initial consultation with me had no symptoms concerning for PAD.  A Ca1 25 had been ordered in the summer 2020  (10/28/2018) and was normal at 8.  The patient is otherwise a fairly healthy woman.  She has a history of a cholecystectomy, 2 prior cesarean sections, and no vaginal deliveries.   She has had a prior endometrial ablation and tubal ligation.    Her mother had 3 cancers including breast, ovarian, and colon cancer.  She ultimately died from colon cancer.  She was never genetically tested.  The patient herself has had genetic consultation performed here at [facility] and this was negative for deleterious mutations.  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  She lives with her husband and 2 children age 59 and 63.  Current Meds:  Outpatient Encounter Medications as of 08/03/2019  Medication Sig  . acetaminophen (TYLENOL) 500 MG tablet Take 2 tablets (1,000 mg total) by mouth every 6 (six) hours as needed for mild pain, moderate pain or fever (or Fever >/= 101).  Marland Kitchen estradiol (VIVELLE-DOT) 0.025 MG/24HR Place 1 patch onto the skin 2 (two) times a week.  . fexofenadine (ALLEGRA) 180 MG tablet Take 180 mg by mouth at bedtime.   . fluticasone (FLONASE) 50 MCG/ACT nasal spray One spray nostril daily for 2 weeks then stop   Give generic (Patient taking differently: Place 1 spray into both nostrils daily as needed for allergies or rhinitis. )  . ibuprofen (ADVIL) 800 MG tablet Take 1 tablet (800 mg total) by mouth every 8 (eight) hours as needed for moderate pain. For AFTER surgery  . Psyllium (METAMUCIL PO) Take 1 Scoop by mouth daily as needed (constipation).  Marland Kitchen senna-docusate (SENOKOT-S) 8.6-50 MG tablet Take 2 tablets by mouth at bedtime. For AFTER surgery, do not take if having diarrhea  . [DISCONTINUED] erythromycin base (E-MYCIN) 500 MG tablet The day before surgery (Feb 17), take 2 tablets at 2pm, 3pm, and 10pm (Patient not taking: Reported on 08/03/2019)  . [DISCONTINUED] neomycin (MYCIFRADIN) 500 MG tablet The day before surgery (Feb 17), take 2 tablets at 2pm, 3pm, and 10pm   . [DISCONTINUED] oxyCODONE (OXY IR/ROXICODONE) 5 MG immediate release tablet Take 1 tablet (5 mg total) by mouth every 4 (four) hours as needed for severe pain. For AFTER surgery, do not take and drive   No facility-administered encounter medications on file as of 08/03/2019.    Allergy:  Allergies  Allergen Reactions  . Bee Venom Anaphylaxis    Social Hx:   Social History   Socioeconomic History  . Marital status: Married    Spouse name: Not on file  . Number of children: 1  . Years of education: Not on file  . Highest education level: Not on file  Occupational History  . Not on file  Tobacco Use  . Smoking status: Never Smoker  . Smokeless tobacco: Never Used  Substance and Sexual Activity  . Alcohol use: Yes    Alcohol/week: 0.0 standard drinks    Comment: 5/month  . Drug use: No  . Sexual activity: Yes    Partners: Male    Birth control/protection: Surgical  Other Topics Concern  . Not on file  Social History Narrative  .  Not on file   Social Determinants of Health   Financial Resource Strain:   . Difficulty of Paying Living Expenses: Not on file  Food Insecurity:   . Worried About Charity fundraiser in the Last Year: Not on file  . Ran Out of Food in the Last Year: Not on file  Transportation Needs:   . Lack of Transportation (Medical): Not on file  . Lack of Transportation (Non-Medical): Not on file  Physical Activity:   . Days of Exercise per Week: Not on file  . Minutes of Exercise per Session: Not on file  Stress:   . Feeling of Stress : Not on file  Social Connections:   . Frequency of Communication with Friends and Family: Not on file  . Frequency of Social Gatherings with Friends and Family: Not on file  . Attends Religious Services: Not on file  . Active Member of Clubs or Organizations: Not on file  . Attends Archivist Meetings: Not on file  . Marital Status: Not on file  Intimate Partner Violence:   . Fear of Current or  Ex-Partner: Not on file  . Emotionally Abused: Not on file  . Physically Abused: Not on file  . Sexually Abused: Not on file    Past Surgical Hx:  Past Surgical History:  Procedure Laterality Date  . ABLATION  11/2011  . CESAREAN SECTION     x2  . CHOLECYSTECTOMY    . COLONOSCOPY  11/19/2018  . LYSIS OF ADHESION N/A 07/15/2019   Procedure: LYSIS OF ADHESION;  Surgeon: Everitt Amber, MD;  Location: [facility];  Service: Gynecology;  Laterality: N/A;  . ROBOTIC ASSISTED TOTAL HYSTERECTOMY WITH BILATERAL SALPINGO OOPHERECTOMY Bilateral 07/15/2019   Procedure: XI ROBOTIC ASSISTED TOTAL HYSTERECTOMY WITH BILATERAL SALPINGO OOPHORECTOMY;  Surgeon: Everitt Amber, MD;  Location: [facility];  Service: Gynecology;  Laterality: Bilateral;  . TUBAL LIGATION      Past Medical Hx:  Past Medical History:  Diagnosis Date  . Adnexal mass    Bilateral  . Anemia   . Anxiety   . Family history of breast cancer in mother   . Family history of breast cancer in mother   . Family history of colon cancer in mother   . Family history of ovarian cancer   . Family history of pancreatic cancer   . History of abnormal cervical Pap smear   . History of diverticulitis   . PID (pelvic inflammatory disease)   . Seasonal allergies     Past Gynecological History:  See HPI No LMP recorded.  Family Hx:  Family History  Problem Relation Age of Onset  . Breast cancer Mother        dx in her 17s  . Ovarian cancer Mother 59  . Colon cancer Mother 82  . Skin cancer Mother   . Heart disease Father   . Diabetes Father   . High blood pressure Father   . Heart attack Father   . Diabetes type II Father   . Diabetes Maternal Grandmother   . Heart disease Maternal Grandmother   . Pancreatic cancer Maternal Grandfather        dx and died in his 44s  . Ovarian cancer Maternal Aunt        dx in her 57s  . Lung cancer Maternal Uncle   . Multiple sclerosis Maternal Aunt   . Prostate cancer Maternal Uncle        metastized  to liver  .  Breast cancer Cousin        maternal first cousin dx in her 38s  . Cancer Cousin        maternal first cousin dx with cancer NOS, causing her to have a TAH-BSO in her 70s    Review of Systems:  Constitutional  Feels well,    ENT Normal appearing ears and nares bilaterally Skin/Breast  No rash, sores, jaundice, itching, dryness Cardiovascular  No chest pain, shortness of breath, or edema  Pulmonary  No cough or wheeze.  Gastro Intestinal  No nausea, vomitting, or diarrhoea. No bright red blood per rectum, no abdominal pain, change in bowel movement, or constipation.  Genito Urinary  No frequency, urgency, dysuria, no pain Musculo Skeletal  No myalgia, arthralgia, joint swelling or pain  Neurologic  No weakness, numbness, change in gait,  Psychology  No depression, anxiety, insomnia.   Vitals:  Blood pressure 124/73, pulse (!) 103, temperature 98.7 F (37.1 C), temperature source Temporal, resp. rate 18, height 5\' 3"  (1.6 m), weight 181 lb 6.4 oz (82.3 kg), SpO2 99 %.  Physical Exam: WD in NAD Neck  Supple NROM, without any enlargements.  Lymph Node Survey No cervical supraclavicular or inguinal adenopathy Cardiovascular  Pulse normal rate, regularity and rhythm. S1 and S2 normal.  Lungs  Clear to auscultation bilateraly, without wheezes/crackles/rhonchi. Good air movement.  Skin  No rash/lesions/breakdown  Psychiatry  Alert and oriented to person, place, and time  Abdomen  Normoactive bowel sounds, abdomen soft, non-tender and non obese without evidence of hernia.Well healed incisions.  Back No CVA tenderness Genito Urinary  Vaginal cuff in tact and healing normally. Rectal  Good tone, no masses no cul de sac nodularity.  Extremities  No bilateral cyanosis, clubbing or edema.   Thereasa Solo, MD  08/03/2019, 1:43 PM

## 2019-08-03 NOTE — Patient Instructions (Signed)
Dr Denman George removed your uterus, cervix, both tubes and ovaries.  In 5 weeks it is safe to resume intercourse if you are not having spotting any more from the vagina.  Continue the estradiol and discuss discontinuation of this after age 49 which is the natural age of menopause. Dr Earleen Newport will be able to discuss options for hormone replacement therapy with you.  Please follow-up with Dr. Earleen Newport annually for a vaginal Pap test.  This should take place until you 20 years past your last abnormal Pap smear.  After that time provided all Paps have been normal, you can discontinue vaginal Pap smears.  Dr. Serita Grit office can be reached at [phone] if you have any questions or concerns regarding her postoperative recovery.

## 2019-10-04 ENCOUNTER — Encounter: Payer: Self-pay | Admitting: Gynecologic Oncology

## 2019-10-04 ENCOUNTER — Telehealth: Payer: Self-pay | Admitting: *Deleted

## 2019-10-04 NOTE — Telephone Encounter (Signed)
Called and scheduled the patient to see Dr Denman George tomorrow

## 2019-10-05 ENCOUNTER — Other Ambulatory Visit: Payer: Self-pay

## 2019-10-05 ENCOUNTER — Inpatient Hospital Stay: Payer: BLUE CROSS/BLUE SHIELD | Attending: Gynecologic Oncology | Admitting: Gynecologic Oncology

## 2019-10-05 ENCOUNTER — Encounter: Payer: Self-pay | Admitting: Gynecologic Oncology

## 2019-10-05 VITALS — BP 116/79 | HR 85 | Temp 98.2°F | Resp 18 | Ht 63.0 in | Wt 181.5 lb

## 2019-10-05 DIAGNOSIS — T8131XA Disruption of external operation (surgical) wound, not elsewhere classified, initial encounter: Secondary | ICD-10-CM | POA: Diagnosis not present

## 2019-10-05 DIAGNOSIS — N939 Abnormal uterine and vaginal bleeding, unspecified: Secondary | ICD-10-CM

## 2019-10-05 DIAGNOSIS — Z90722 Acquired absence of ovaries, bilateral: Secondary | ICD-10-CM | POA: Diagnosis not present

## 2019-10-05 DIAGNOSIS — Z90710 Acquired absence of both cervix and uterus: Secondary | ICD-10-CM | POA: Insufficient documentation

## 2019-10-05 DIAGNOSIS — N730 Acute parametritis and pelvic cellulitis: Secondary | ICD-10-CM | POA: Diagnosis present

## 2019-10-05 MED ORDER — ESTRADIOL 0.1 MG/GM VA CREA: 1.0000 | TOPICAL_CREAM | Freq: Every day | VAGINAL | 12 refills | Status: AC

## 2019-10-05 NOTE — Patient Instructions (Signed)
Please use the vaginal estrace cream in the vagina once a night for 2 months. Please avoid placing anything in the vagina for 2 months (including no intercourse, tampons, douching etc).  Dr Denman George will see you back in 6 weeks to re-evaluate how the vaginal tissues are healing.

## 2019-10-05 NOTE — Progress Notes (Signed)
Follow-up Note: Gyn-Onc  Consult was initially requested by Dr. Nelva Bush for the evaluation of Megan Gibson 49 y.o. female  CC:  Chief Complaint  Patient presents with  . vaginal bleeding after hysterectomy    Assessment/Plan:  Megan Gibson  is a 49 y.o.  year old with a history of PID and bilateral adnexal masses who is 3 weeks s/p robotic assisted TLH, BSO on July 15, 2019.  Postop vaginal cuff mucosal dehiscence. No indication for surgical repair given well healed deeper fascial layer. Recommend vaginal estrogen for 2 months. Pelvic rest for 2 months. Recommend she return in 6 weeks for evaluation.   Interval Hx:  She underwent a 50-month period of surveillance to allow cooling off of her pelvic inflammatory disease.  On July 15, 2019 she underwent a robotic assisted total hysterectomy with bilateral salpingo-oophorectomy and lysis of adhesions.  Intraoperative findings were significant for omental adhesions to a fascial defect in her prior low transverse incision, normal-appearing appendix.  Adhesions between the sigmoid colon and posterior uterus.  The bladder was adherent to the anterior lower uterine segment.  There was a left pelvic pseudocyst which was evacuated.  There were grossly normal ovaries bilaterally and a 12 cm fibroid uterus.  Final pathology confirmed benign uterus with fibrous adhesions on the serosa, benign cervix without dysplasia or malignancy, bilateral ovaries that contain inclusion cysts and no malignancy, focal chronic inflammation in bilateral fallopian tubes consistent with her history of prior pelvic inflammatory disease.  Postoperatively she did well with no specific complaints.  HPI: Megan Gibson is a 49 year old P2 who is seen in consultation at the request of Dr. Nelva Bush for evaluation of pelvic inflammatory disease and bilateral tubo-ovarian abscesses.  The patient initially had a bout of pelvic inflammatory disease in August  2019.  This was felt to be subsequent to an episode of diverticulitis and at the time was felt to be diverticulitis and not pelvic inflammatory disease.  In October 2020 she began experiencing left-sided pelvic bloating and pain.  She denied fever.  She was evaluated seen and admitted to [facility] with a diagnosis of tubo-ovarian abscesses and pelvic inflammatory disease.  She was treated with IV antibiotics over 48 hours.  Then discharged home on 2-week course of oral antibiotics including Flagyl and possibly Augmentin.  During that admission a CT scan of the abdomen and pelvis was performed on March 14, 2019.  This revealed small volume pelvic ascites with pelvic mesenteric stranding correlate for PID.  Prior secondary inflammatory changes involving the sigmoid colon were not evident on the current study.  A transvaginal ultrasound was performed the following day on March 15, 2019.  This revealed a uterus measuring 8.9 x 5.2 x 6.1 cm with no fibroids or other masses visualized.  The endometrium was 10 mm.  The right ovary measured 3 x 1.7 x 1.7 cm and contained a 2.8 cm complex cystic area.  The left ovary measured 3.5 x 2 x 2.7 cm and contained multiple complex cystic areas in the adnexa including a 4.5 x 2.9 x 2.2 cm complex area lateral to the left ovary and a 3.7 x 4 x 2.8 cm area medial to the left ovary.  She subsequently felt much better and at the time of her initial consultation with me had no symptoms concerning for PAD.  A Ca1 25 had been ordered in the summer 2020 (10/28/2018) and was normal at 8.  The patient is otherwise a  fairly healthy woman.  She has a history of a cholecystectomy, 2 prior cesarean sections, and no vaginal deliveries.   She has had a prior endometrial ablation and tubal ligation.    Her mother had 3 cancers including breast, ovarian, and colon cancer.  She ultimately died from colon cancer.  She was never genetically tested.  The patient herself has had  genetic consultation performed here at [facility] and this was negative for deleterious mutations.  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  She lives with her husband and 2 children age 66 and 55.  Interval Hx:  On 07/14/19 she underwent a robotic assisted total hysterectomy with BSO for TOA. Surgery was uncomplicated. She was evaluated at 4 weeks and was doing well with a well healed vaginal cuff on examination.  However, she began intercourse between 4-8 weeks postop, and then experienced abdominal cramping. This continued after she had sex 2 additional times with cramping and bleeding. We instructed her to be seen the next day and she presents for that visit.   Current Meds:  Outpatient Encounter Medications as of 10/05/2019  Medication Sig  . acetaminophen (TYLENOL) 500 MG tablet Take 2 tablets (1,000 mg total) by mouth every 6 (six) hours as needed for mild pain, moderate pain or fever (or Fever >/= 101).  Marland Kitchen estradiol (VIVELLE-DOT) 0.025 MG/24HR Place 1 patch onto the skin 2 (two) times a week.  . fexofenadine (ALLEGRA) 180 MG tablet Take 180 mg by mouth at bedtime.   . fluticasone (FLONASE) 50 MCG/ACT nasal spray One spray nostril daily for 2 weeks then stop   Give generic (Patient taking differently: Place 1 spray into both nostrils daily as needed for allergies or rhinitis. )  . ibuprofen (ADVIL) 800 MG tablet Take 1 tablet (800 mg total) by mouth every 8 (eight) hours as needed for moderate pain. For AFTER surgery  . Psyllium (METAMUCIL PO) Take 1 Scoop by mouth daily as needed (constipation).  Marland Kitchen senna-docusate (SENOKOT-S) 8.6-50 MG tablet Take 2 tablets by mouth at bedtime. For AFTER surgery, do not take if having diarrhea  . estradiol (ESTRACE VAGINAL) 0.1 MG/GM vaginal cream Place 1 Applicatorful vaginally at bedtime.   No facility-administered encounter medications on file as of 10/05/2019.    Allergy:  Allergies  Allergen Reactions  . Bee Venom  Anaphylaxis    Social Hx:   Social History   Socioeconomic History  . Marital status: Married    Spouse name: Not on file  . Number of children: 1  . Years of education: Not on file  . Highest education level: Not on file  Occupational History  . Not on file  Tobacco Use  . Smoking status: Never Smoker  . Smokeless tobacco: Never Used  Substance and Sexual Activity  . Alcohol use: Yes    Alcohol/week: 0.0 standard drinks    Comment: 5/month  . Drug use: No  . Sexual activity: Yes    Partners: Male    Birth control/protection: Surgical  Other Topics Concern  . Not on file  Social History Narrative  . Not on file   Social Determinants of Health   Financial Resource Strain:   . Difficulty of Paying Living Expenses:   Food Insecurity:   . Worried About Charity fundraiser in the Last Year:   . Arboriculturist in the Last Year:   Transportation Needs:   . Film/video editor (Medical):   Marland Kitchen Lack  of Transportation (Non-Medical):   Physical Activity:   . Days of Exercise per Week:   . Minutes of Exercise per Session:   Stress:   . Feeling of Stress :   Social Connections:   . Frequency of Communication with Friends and Family:   . Frequency of Social Gatherings with Friends and Family:   . Attends Religious Services:   . Active Member of Clubs or Organizations:   . Attends Archivist Meetings:   Marland Kitchen Marital Status:   Intimate Partner Violence:   . Fear of Current or Ex-Partner:   . Emotionally Abused:   Marland Kitchen Physically Abused:   . Sexually Abused:     Past Surgical Hx:  Past Surgical History:  Procedure Laterality Date  . ABLATION  11/2011  . CESAREAN SECTION     x2  . CHOLECYSTECTOMY    . COLONOSCOPY  11/19/2018  . LYSIS OF ADHESION N/A 07/15/2019   Procedure: LYSIS OF ADHESION;  Surgeon: Everitt Amber, MD;  Location: [facility];  Service: Gynecology;  Laterality: N/A;  . ROBOTIC ASSISTED TOTAL HYSTERECTOMY WITH BILATERAL SALPINGO OOPHERECTOMY Bilateral  07/15/2019   Procedure: XI ROBOTIC ASSISTED TOTAL HYSTERECTOMY WITH BILATERAL SALPINGO OOPHORECTOMY;  Surgeon: Everitt Amber, MD;  Location: [facility];  Service: Gynecology;  Laterality: Bilateral;  . TUBAL LIGATION      Past Medical Hx:  Past Medical History:  Diagnosis Date  . Adnexal mass    Bilateral  . Anemia   . Anxiety   . Family history of breast cancer in mother   . Family history of breast cancer in mother   . Family history of colon cancer in mother   . Family history of ovarian cancer   . Family history of pancreatic cancer   . History of abnormal cervical Pap smear   . History of diverticulitis   . PID (pelvic inflammatory disease)   . Seasonal allergies     Past Gynecological History:  See HPI No LMP recorded.  Family Hx:  Family History  Problem Relation Age of Onset  . Breast cancer Mother        dx in her 34s  . Ovarian cancer Mother 17  . Colon cancer Mother 9  . Skin cancer Mother   . Heart disease Father   . Diabetes Father   . High blood pressure Father   . Heart attack Father   . Diabetes type II Father   . Diabetes Maternal Grandmother   . Heart disease Maternal Grandmother   . Pancreatic cancer Maternal Grandfather        dx and died in his 73s  . Ovarian cancer Maternal Aunt        dx in her 52s  . Lung cancer Maternal Uncle   . Multiple sclerosis Maternal Aunt   . Prostate cancer Maternal Uncle        metastized to liver  . Breast cancer Cousin        maternal first cousin dx in her 29s  . Cancer Cousin        maternal first cousin dx with cancer NOS, causing her to have a TAH-BSO in her 64s    Review of Systems:  Constitutional  Feels well,    ENT Normal appearing ears and nares bilaterally Skin/Breast  No rash, sores, jaundice, itching, dryness Cardiovascular  No chest pain, shortness of breath, or edema  Pulmonary  No cough or wheeze.  Gastro Intestinal  No nausea, vomitting, or diarrhoea. No bright  red blood per rectum, no  abdominal pain, change in bowel movement, or constipation.  Genito Urinary  No frequency, urgency, dysuria, no pain Musculo Skeletal  No myalgia, arthralgia, joint swelling or pain  Neurologic  No weakness, numbness, change in gait,  Psychology  No depression, anxiety, insomnia.   Vitals:  Blood pressure 116/79, pulse 85, temperature 98.2 F (36.8 C), temperature source Temporal, resp. rate 18, height 5\' 3"  (1.6 m), weight 181 lb 8 oz (82.3 kg), SpO2 100 %.  Physical Exam: WD in NAD Neck  Supple NROM, without any enlargements.  Lymph Node Survey No cervical supraclavicular or inguinal adenopathy Cardiovascular  Pulse normal rate, regularity and rhythm. S1 and S2 normal.  Lungs  Clear to auscultation bilateraly, without wheezes/crackles/rhonchi. Good air movement.  Skin  No rash/lesions/breakdown  Psychiatry  Alert and oriented to person, place, and time  Abdomen  Normoactive bowel sounds, abdomen soft, non-tender and non obese without evidence of hernia.Well healed incisions.  Back No CVA tenderness Genito Urinary  Vaginal cuff with a 1.5cm central area of mucosal separation. Underlying endopelvic fascia visibly and palpably in tact.  Rectal  Good tone, no masses no cul de sac nodularity.  Extremities  No bilateral cyanosis, clubbing or edema.   Thereasa Solo, MD  10/05/2019, 11:47 AM

## 2019-11-24 ENCOUNTER — Other Ambulatory Visit: Payer: Self-pay

## 2019-11-24 ENCOUNTER — Inpatient Hospital Stay: Payer: BLUE CROSS/BLUE SHIELD | Attending: Gynecologic Oncology | Admitting: Gynecologic Oncology

## 2019-11-24 ENCOUNTER — Encounter: Payer: Self-pay | Admitting: Gynecologic Oncology

## 2019-11-24 VITALS — BP 129/106 | HR 78 | Temp 98.3°F | Resp 20 | Ht 63.0 in | Wt 178.8 lb

## 2019-11-24 DIAGNOSIS — Z90722 Acquired absence of ovaries, bilateral: Secondary | ICD-10-CM | POA: Diagnosis not present

## 2019-11-24 DIAGNOSIS — N730 Acute parametritis and pelvic cellulitis: Secondary | ICD-10-CM | POA: Insufficient documentation

## 2019-11-24 DIAGNOSIS — Z90710 Acquired absence of both cervix and uterus: Secondary | ICD-10-CM | POA: Diagnosis not present

## 2019-11-24 DIAGNOSIS — T8131XA Disruption of external operation (surgical) wound, not elsewhere classified, initial encounter: Secondary | ICD-10-CM | POA: Diagnosis not present

## 2019-11-24 NOTE — Progress Notes (Signed)
Follow-up Note: Gyn-Onc  Consult was initially requested by Dr. Nelva Bush for the evaluation of Megan Gibson 49 y.o. female  CC:  Chief Complaint  Patient presents with  . vaginal cuff dehiscence    Follow up    Assessment/Plan:  Megan Gibson  is a 49 y.o.  year old with a history of PID and bilateral adnexal masses who is 3 weeks s/p robotic assisted TLH, BSO on July 15, 2019.  Postop vaginal cuff mucosal dehiscence -healing normally. Recommend 2 more weeks of premarin then no further pelvic rest.  Follow-up with Dr Earleen Newport for routine gynecologic care.    HPI: Megan Gibson is a 49 year old P2 who is seen in consultation at the request of Dr. Nelva Bush for evaluation of pelvic inflammatory disease and bilateral tubo-ovarian abscesses.  The patient initially had a bout of pelvic inflammatory disease in August 2019.  This was felt to be subsequent to an episode of diverticulitis and at the time was felt to be diverticulitis and not pelvic inflammatory disease.  In October 2020 she began experiencing left-sided pelvic bloating and pain.  She denied fever.  She was evaluated seen and admitted to [facility] with a diagnosis of tubo-ovarian abscesses and pelvic inflammatory disease.  She was treated with IV antibiotics over 48 hours.  Then discharged home on 2-week course of oral antibiotics including Flagyl and possibly Augmentin.  During that admission a CT scan of the abdomen and pelvis was performed on March 14, 2019.  This revealed small volume pelvic ascites with pelvic mesenteric stranding correlate for PID.  Prior secondary inflammatory changes involving the sigmoid colon were not evident on the current study.  A transvaginal ultrasound was performed the following day on March 15, 2019.  This revealed a uterus measuring 8.9 x 5.2 x 6.1 cm with no fibroids or other masses visualized.  The endometrium was 10 mm.  The right ovary measured 3 x 1.7 x 1.7 cm and  contained a 2.8 cm complex cystic area.  The left ovary measured 3.5 x 2 x 2.7 cm and contained multiple complex cystic areas in the adnexa including a 4.5 x 2.9 x 2.2 cm complex area lateral to the left ovary and a 3.7 x 4 x 2.8 cm area medial to the left ovary.  She subsequently felt much better and at the time of her initial consultation with me had no symptoms concerning for PAD.  A Ca1 25 had been ordered in the summer 2020 (10/28/2018) and was normal at 8.  The patient is otherwise a fairly healthy woman.  She has a history of a cholecystectomy, 2 prior cesarean sections, and no vaginal deliveries.   She has had a prior endometrial ablation and tubal ligation.    Her mother had 3 cancers including breast, ovarian, and colon cancer.  She ultimately died from colon cancer.  She was never genetically tested.  The patient herself has had genetic consultation performed here at [facility] and this was negative for deleterious mutations.  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  She lives with her husband and 2 children age 26 and 83.  Interval Hx:  On 07/14/19 (after a period of "cooling off" after her bout of PID) she underwent a robotic assisted total hysterectomy with BSO for TOA. Surgery was uncomplicated. She was evaluated at 4 weeks and was doing well with a well healed vaginal cuff on examination.  In May, 2021 (  3 months postop) she was diagnosed with a vaginal cuff mucosal separation/dehiscence and was prescribed premarin vaginal cream and pelvic rest for 6 weeks. She returns today for follow-up of response to therapy. She reported no further bleeding in the 2 preceding weeks.     Current Meds:  Outpatient Encounter Medications as of 11/24/2019  Medication Sig  . acetaminophen (TYLENOL) 500 MG tablet Take 2 tablets (1,000 mg total) by mouth every 6 (six) hours as needed for mild pain, moderate pain or fever (or Fever >/= 101).  Marland Kitchen estradiol (ESTRACE  VAGINAL) 0.1 MG/GM vaginal cream Place 1 Applicatorful vaginally at bedtime.  Marland Kitchen estradiol (VIVELLE-DOT) 0.025 MG/24HR Place 1 patch onto the skin 2 (two) times a week.  . fexofenadine (ALLEGRA) 180 MG tablet Take 180 mg by mouth at bedtime.   . fluticasone (FLONASE) 50 MCG/ACT nasal spray One spray nostril daily for 2 weeks then stop   Give generic (Patient taking differently: Place 1 spray into both nostrils daily as needed for allergies or rhinitis. )  . ibuprofen (ADVIL) 800 MG tablet Take 1 tablet (800 mg total) by mouth every 8 (eight) hours as needed for moderate pain. For AFTER surgery  . Psyllium (METAMUCIL PO) Take 1 Scoop by mouth daily as needed (constipation).  Marland Kitchen senna-docusate (SENOKOT-S) 8.6-50 MG tablet Take 2 tablets by mouth at bedtime. For AFTER surgery, do not take if having diarrhea   No facility-administered encounter medications on file as of 11/24/2019.    Allergy:  Allergies  Allergen Reactions  . Bee Venom Anaphylaxis    Social Hx:   Social History   Socioeconomic History  . Marital status: Married    Spouse name: Not on file  . Number of children: 1  . Years of education: Not on file  . Highest education level: Not on file  Occupational History  . Not on file  Tobacco Use  . Smoking status: Never Smoker  . Smokeless tobacco: Never Used  Vaping Use  . Vaping Use: Never used  Substance and Sexual Activity  . Alcohol use: Yes    Alcohol/week: 0.0 standard drinks    Comment: 5/month  . Drug use: No  . Sexual activity: Yes    Partners: Male    Birth control/protection: Surgical  Other Topics Concern  . Not on file  Social History Narrative  . Not on file   Social Determinants of Health   Financial Resource Strain:   . Difficulty of Paying Living Expenses:   Food Insecurity:   . Worried About Charity fundraiser in the Last Year:   . Arboriculturist in the Last Year:   Transportation Needs:   . Film/video editor (Medical):   Marland Kitchen Lack of  Transportation (Non-Medical):   Physical Activity:   . Days of Exercise per Week:   . Minutes of Exercise per Session:   Stress:   . Feeling of Stress :   Social Connections:   . Frequency of Communication with Friends and Family:   . Frequency of Social Gatherings with Friends and Family:   . Attends Religious Services:   . Active Member of Clubs or Organizations:   . Attends Archivist Meetings:   Marland Kitchen Marital Status:   Intimate Partner Violence:   . Fear of Current or Ex-Partner:   . Emotionally Abused:   Marland Kitchen Physically Abused:   . Sexually Abused:     Past Surgical Hx:  Past Surgical History:  Procedure Laterality Date  .  ABLATION  11/2011  . CESAREAN SECTION     x2  . CHOLECYSTECTOMY    . COLONOSCOPY  11/19/2018  . LYSIS OF ADHESION N/A 07/15/2019   Procedure: LYSIS OF ADHESION;  Surgeon: Everitt Amber, MD;  Location: [facility];  Service: Gynecology;  Laterality: N/A;  . ROBOTIC ASSISTED TOTAL HYSTERECTOMY WITH BILATERAL SALPINGO OOPHERECTOMY Bilateral 07/15/2019   Procedure: XI ROBOTIC ASSISTED TOTAL HYSTERECTOMY WITH BILATERAL SALPINGO OOPHORECTOMY;  Surgeon: Everitt Amber, MD;  Location: [facility];  Service: Gynecology;  Laterality: Bilateral;  . TUBAL LIGATION      Past Medical Hx:  Past Medical History:  Diagnosis Date  . Adnexal mass    Bilateral  . Anemia   . Anxiety   . Family history of breast cancer in mother   . Family history of breast cancer in mother   . Family history of colon cancer in mother   . Family history of ovarian cancer   . Family history of pancreatic cancer   . History of abnormal cervical Pap smear   . History of diverticulitis   . PID (pelvic inflammatory disease)   . Seasonal allergies     Past Gynecological History:  See HPI No LMP recorded.  Family Hx:  Family History  Problem Relation Age of Onset  . Breast cancer Mother        dx in her 45s  . Ovarian cancer Mother 70  . Colon cancer Mother 57  . Skin cancer Mother   .  Heart disease Father   . Diabetes Father   . High blood pressure Father   . Heart attack Father   . Diabetes type II Father   . Diabetes Maternal Grandmother   . Heart disease Maternal Grandmother   . Pancreatic cancer Maternal Grandfather        dx and died in his 15s  . Ovarian cancer Maternal Aunt        dx in her 37s  . Lung cancer Maternal Uncle   . Multiple sclerosis Maternal Aunt   . Prostate cancer Maternal Uncle        metastized to liver  . Breast cancer Cousin        maternal first cousin dx in her 32s  . Cancer Cousin        maternal first cousin dx with cancer NOS, causing her to have a TAH-BSO in her 45s    Review of Systems:  Constitutional  Feels well,    ENT Normal appearing ears and nares bilaterally Skin/Breast  No rash, sores, jaundice, itching, dryness Cardiovascular  No chest pain, shortness of breath, or edema  Pulmonary  No cough or wheeze.  Gastro Intestinal  No nausea, vomitting, or diarrhoea. No bright red blood per rectum, no abdominal pain, change in bowel movement, or constipation.  Genito Urinary  No frequency, urgency, dysuria, no pain Musculo Skeletal  No myalgia, arthralgia, joint swelling or pain  Neurologic  No weakness, numbness, change in gait,  Psychology  No depression, anxiety, insomnia.   Vitals:  Blood pressure (!) 129/106, pulse 78, temperature 98.3 F (36.8 C), temperature source Oral, resp. rate 20, height 5\' 3"  (1.6 m), weight 178 lb 12.8 oz (81.1 kg), SpO2 99 %.  Physical Exam: WD in NAD Neck  Supple NROM, without any enlargements.  Lymph Node Survey No cervical supraclavicular or inguinal adenopathy Cardiovascular  Pulse normal rate, regularity and rhythm. S1 and S2 normal.  Lungs  Clear to auscultation bilateraly, without wheezes/crackles/rhonchi. Good air  movement.  Skin  No rash/lesions/breakdown  Psychiatry  Alert and oriented to person, place, and time  Abdomen  Normoactive bowel sounds, abdomen soft,  non-tender and non obese without evidence of hernia.Well healed incisions.  Back No CVA tenderness Genito Urinary  Vaginal cuff well healed with no mucosal separation. 80mm area of granulation tissue centrally. Palpably in tact vaginal cuff.  Rectal  Good tone, no masses no cul de sac nodularity.  Extremities  No bilateral cyanosis, clubbing or edema.   Thereasa Solo, MD  11/24/2019, 2:03 PM

## 2019-11-24 NOTE — Patient Instructions (Signed)
Please use the vaginal premarin cream for 2 more weeks.  After 2 weeks you can resume intercourse as desired.  Please call Dr Denman George at [phone] with questions. Please follow-up with Dr Earleen Newport annually for wellness checks.

## 2021-06-30 IMAGING — CT CT ABD-PELV W/ CM
2 of 5 series · 16 of 46 positions shown, 18 images · IV contrast (APPLIED)
Comparison: 01/15/2018

CLINICAL DATA: Lower abdominal pain

EXAM:
CT ABDOMEN AND PELVIS WITH CONTRAST
TECHNIQUE: Multidetector CT imaging of the abdomen and pelvis was performed
using the standard protocol following bolus administration of
intravenous contrast.
CONTRAST:  100mL OMNIPAQUE IOHEXOL 300 MG/ML  SOLN

[Series 2: axial st · axial · 0.92mm/px · z∈[-442,-46]mm · 13 of 93 slices shown, 15 images]
[im 7/93  soft-tissue]
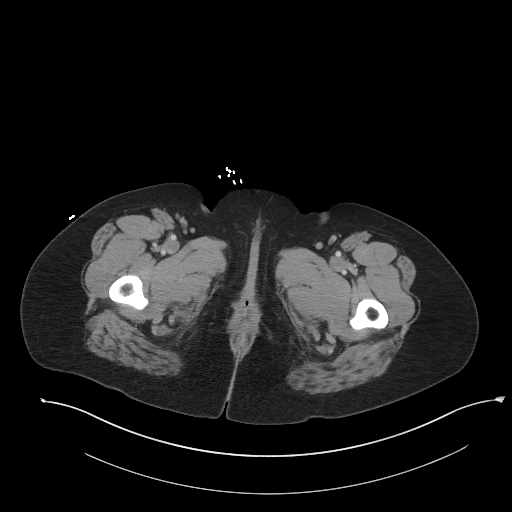
[im 7/93  bone]
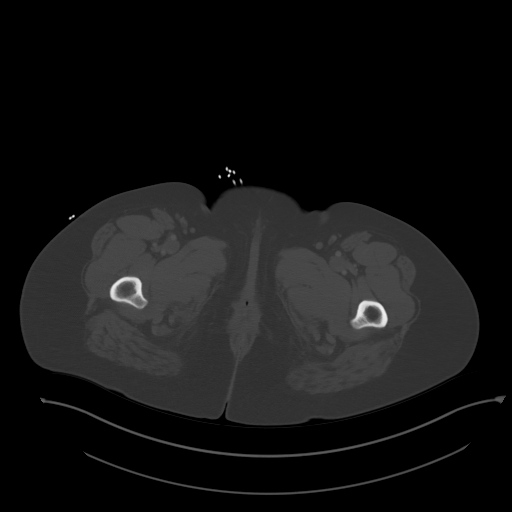
[im 13/93  soft-tissue]
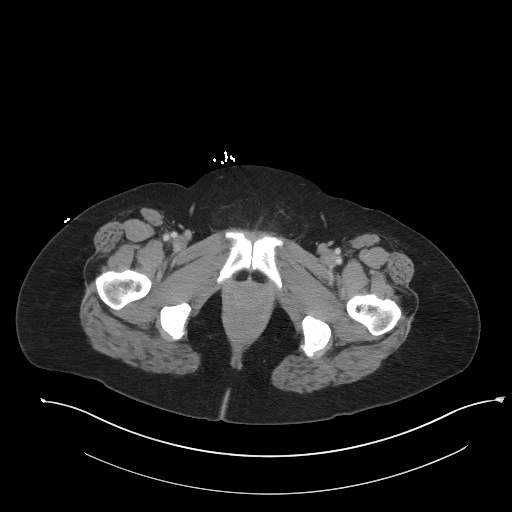
[im 19/93  soft-tissue]
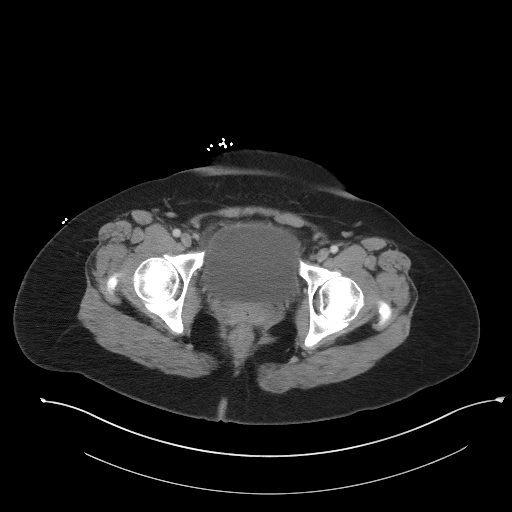
[im 25/93  soft-tissue]
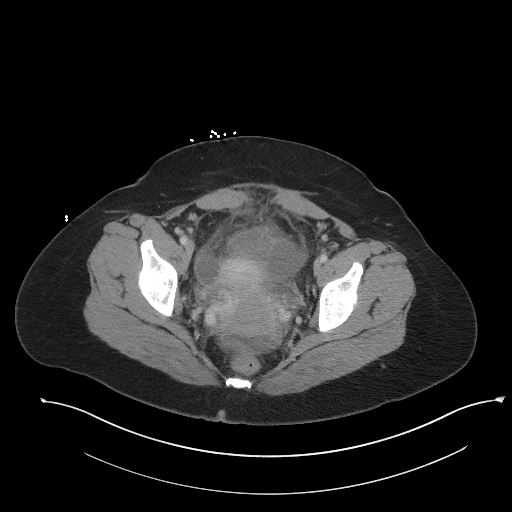
[im 31/93  soft-tissue]
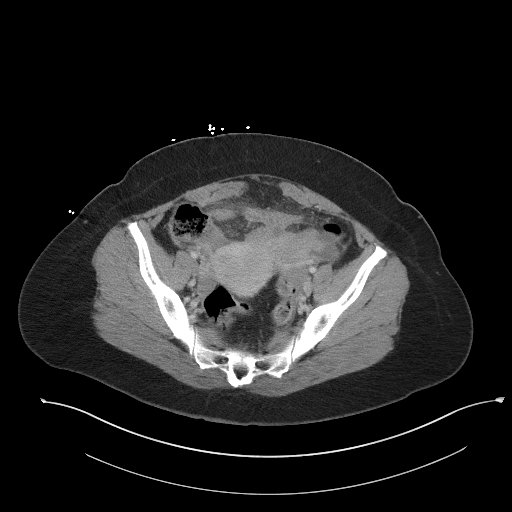
[im 37/93  soft-tissue]
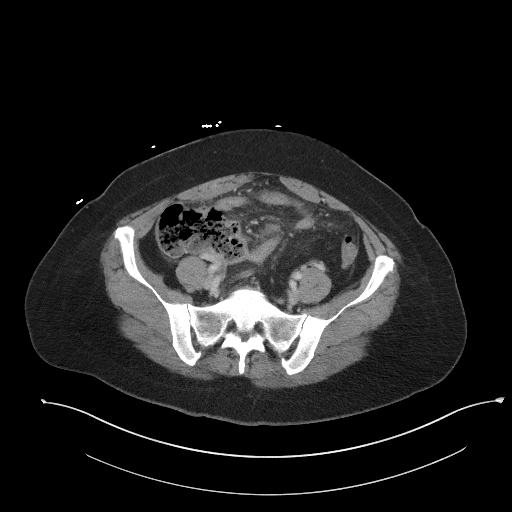
[im 50/93  soft-tissue]
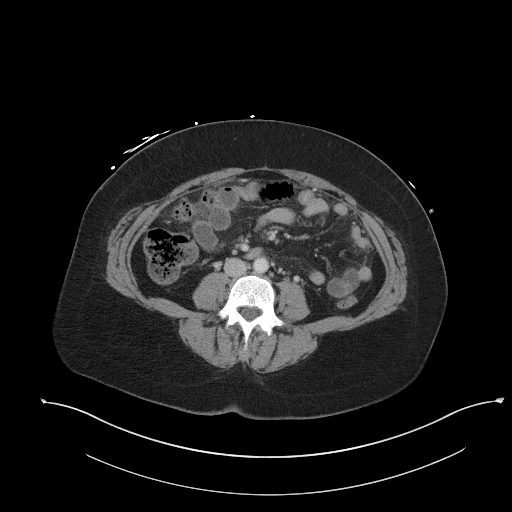
[im 56/93  soft-tissue]
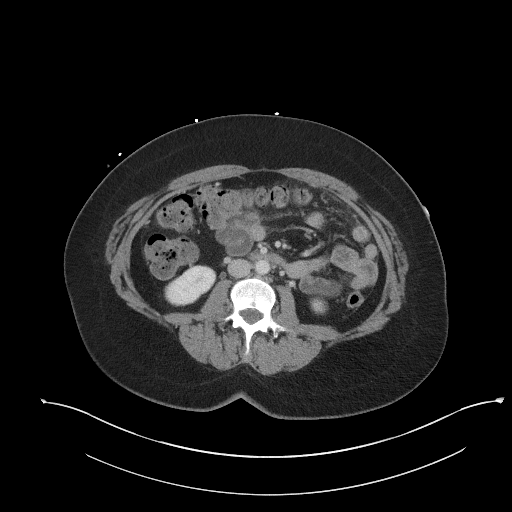
[im 62/93  soft-tissue]
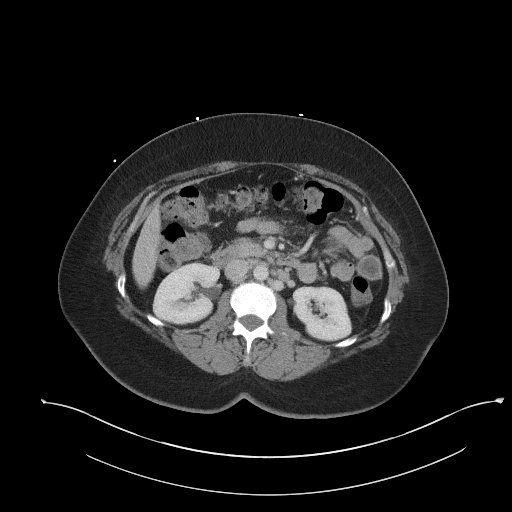
[im 62/93  bone]
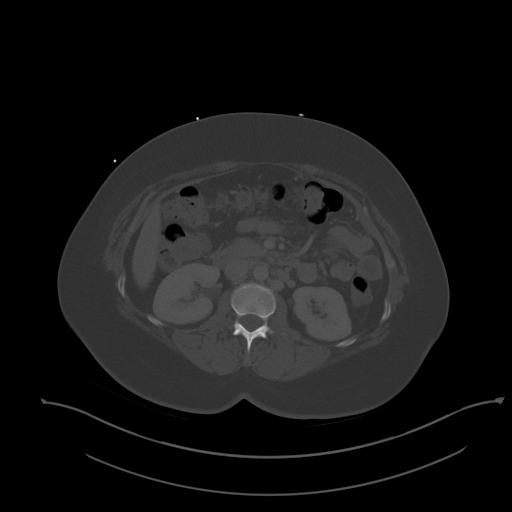
[im 68/93  soft-tissue]
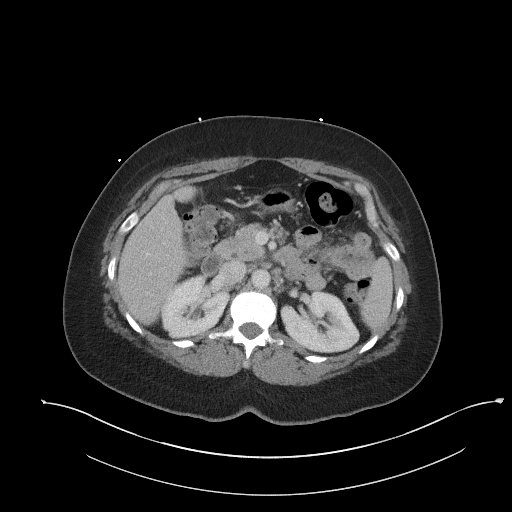
[im 74/93  soft-tissue]
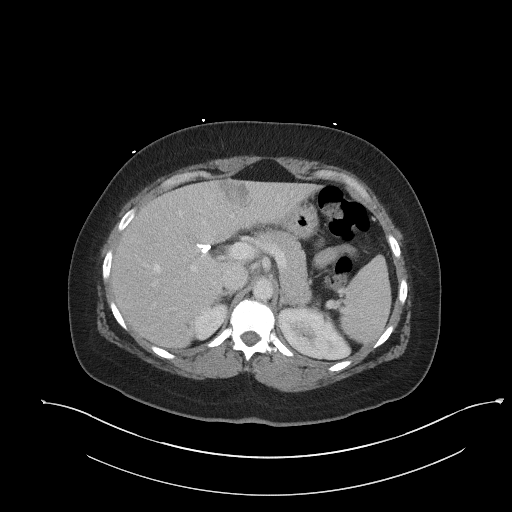
[im 80/93  soft-tissue]
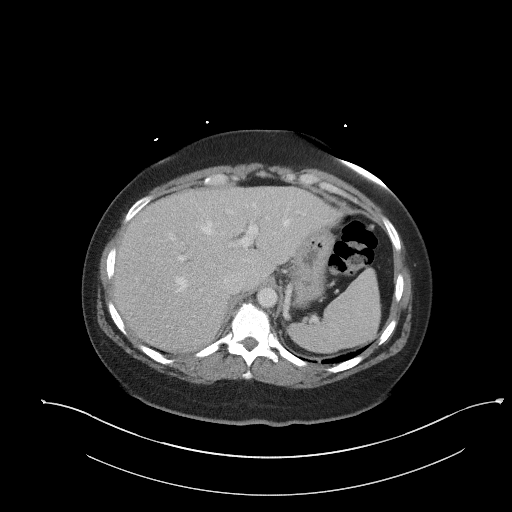
[im 86/93  soft-tissue]
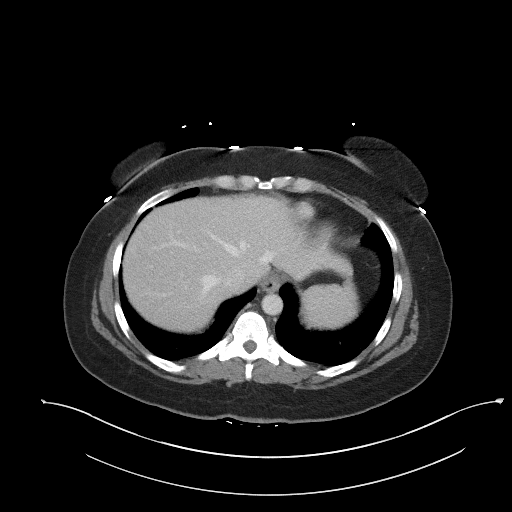

[Series 5: coronal st · coronal · 0.86mm/px · 3 of 101 slices shown]
[im 34/101  soft-tissue]
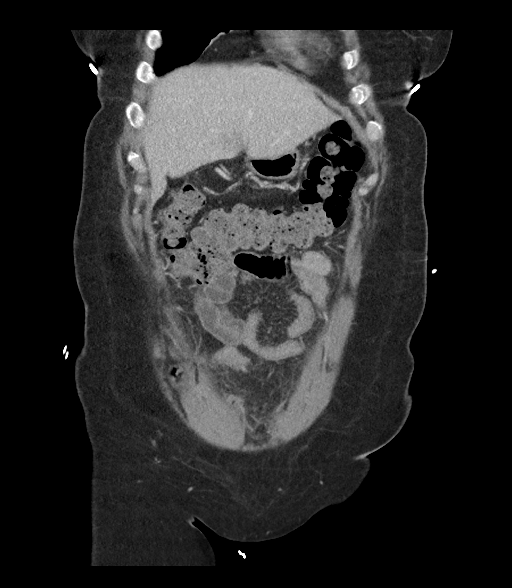
[im 45/101  soft-tissue]
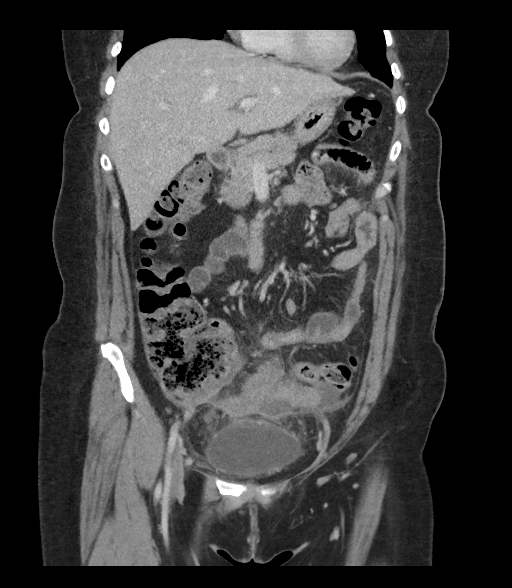
[im 56/101  soft-tissue]
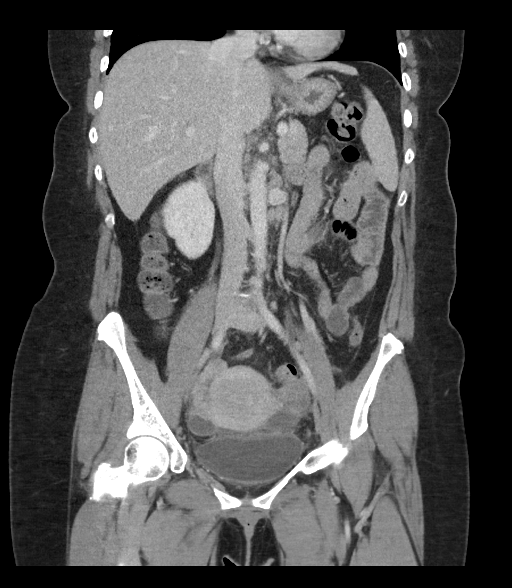

[16 of 46 positions shown; findings below may reference images not displayed]

FINDINGS: Lower chest: Lung bases are clear.

Hepatobiliary: Focal fat/altered perfusion along the falciform
ligament (series 2/image 21).

Status post cholecystectomy. No intrahepatic or extrahepatic ductal
dilatation.

Pancreas: Within normal limits.

Spleen: Within normal limits.

Adrenals/Urinary Tract: Adrenal glands are within normal limits.

Kidneys are within normal limits.  No hydronephrosis.

Bladder is within normal limits.

Stomach/Bowel: Stomach is within normal limits.

No evidence of bowel obstruction.

Normal appendix (series 2/image 87).

No colonic wall thickening or inflammatory changes.

Vascular/Lymphatic: No evidence of abdominal aortic aneurysm.

No suspicious abdominopelvic lymphadenopathy.

Reproductive: Uterus is within normal limits.

Bilateral ovaries are grossly unremarkable on CT, with suspected
hydrosalpinx bilaterally (series 2/image 65).

Other: Small volume pelvic ascites with pelvic mesenteric stranding,
similar to the prior, favoring PID.

Musculoskeletal: Visualized osseous structures are within normal
limits.
IMPRESSION: Small volume pelvic ascites with pelvic mesenteric stranding,
correlate for PID.

Prior secondary inflammatory changes involving the sigmoid colon are
not evident on the current study.

Additional stable ancillary findings as above.

## 2021-07-01 IMAGING — US US PELVIS COMPLETE WITH TRANSVAGINAL
1 series · 13 of 25 positions shown · non-contrast
Comparison: CT 03/14/2019

CLINICAL DATA: Pelvic inflammatory disease on recent CT



[Series 1: us pelvis complete with transvaginal · 89 acquisitions, 13 frames shown]
[im 1/89]
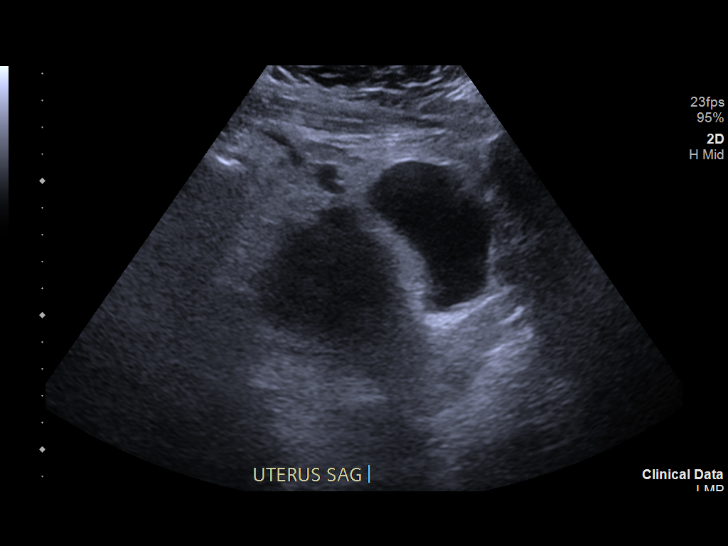
[im 8/89]
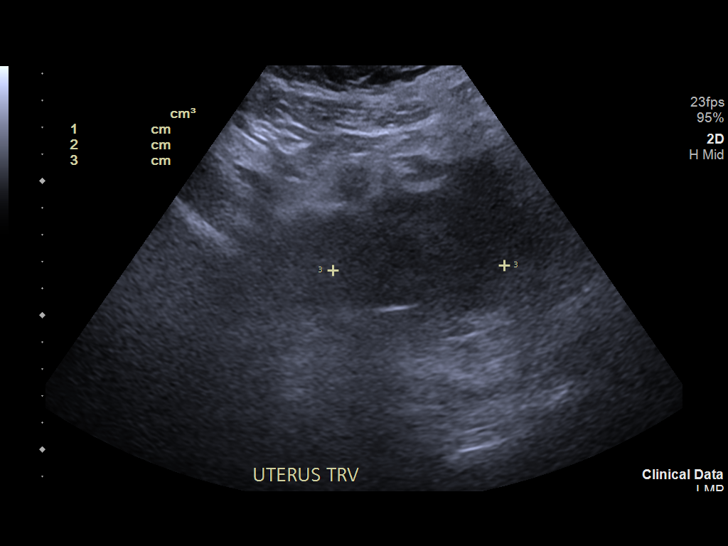
[im 15/89]
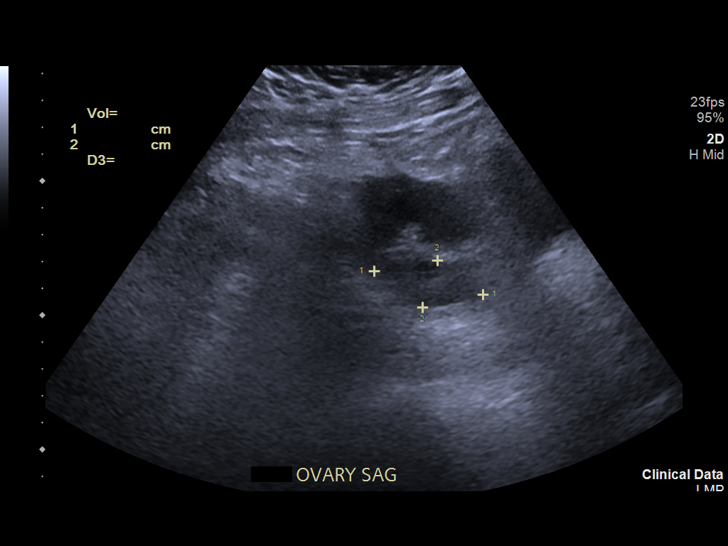
[im 23/89]
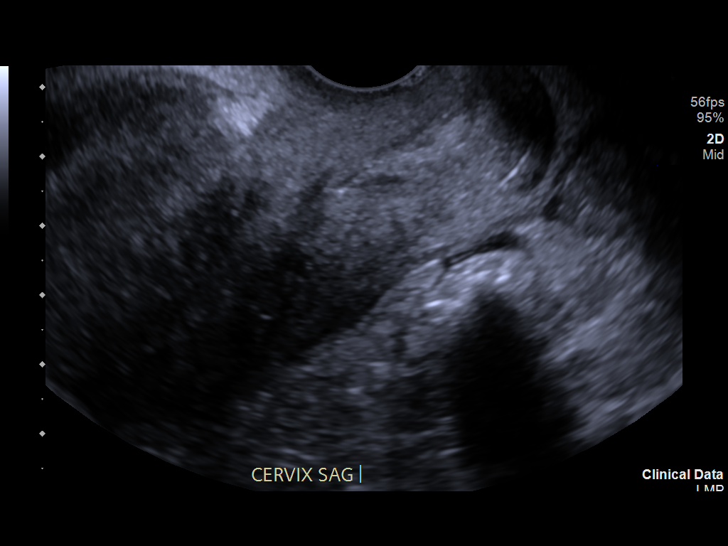
[im 30/89]
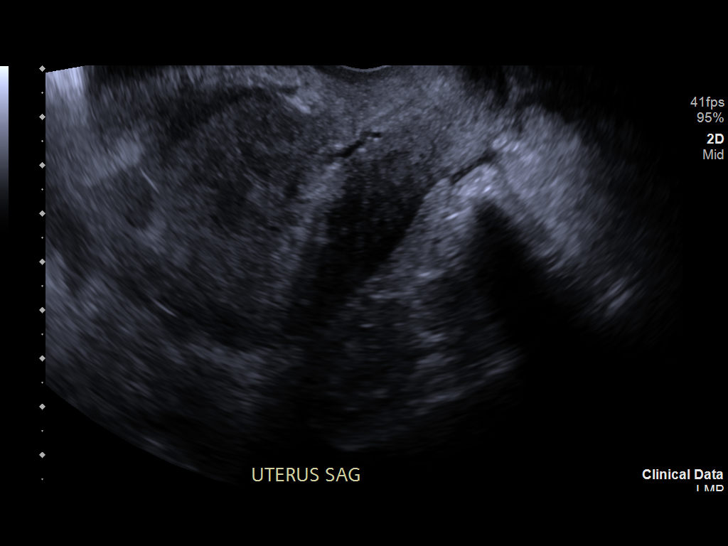
[im 37/89]
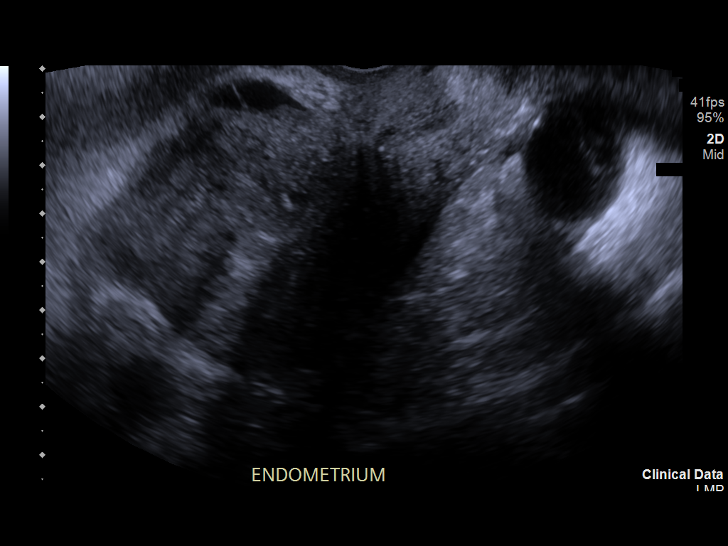
[im 45/89]
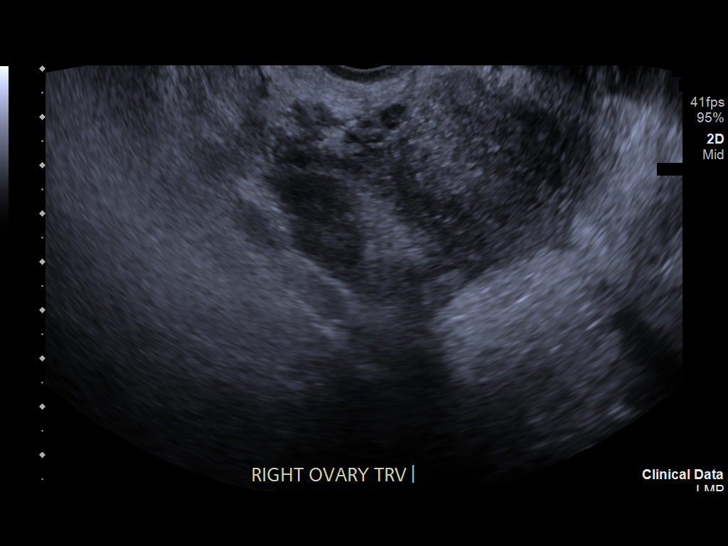
[im 52/89]
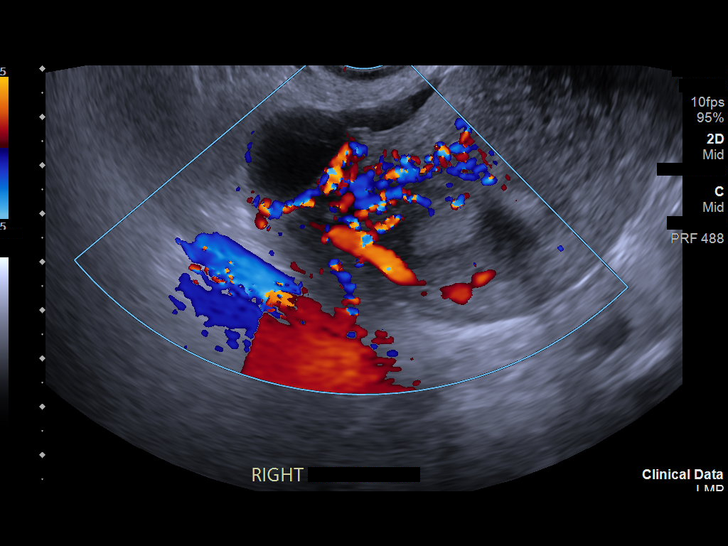
[im 59/89]
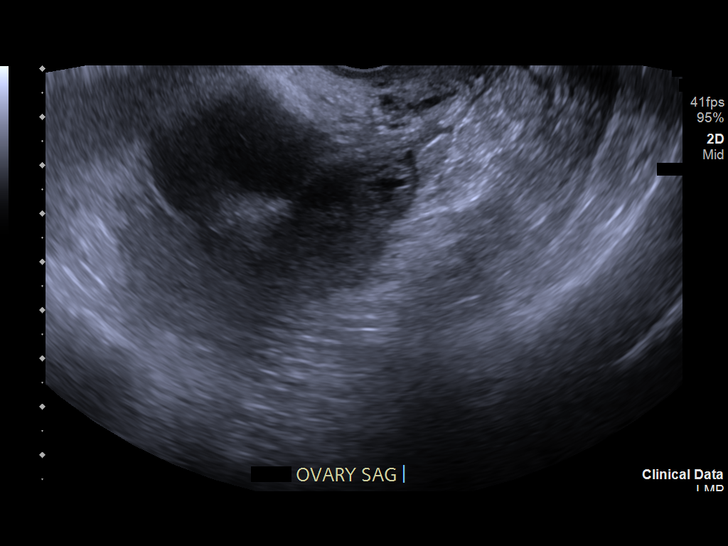
[im 67/89]
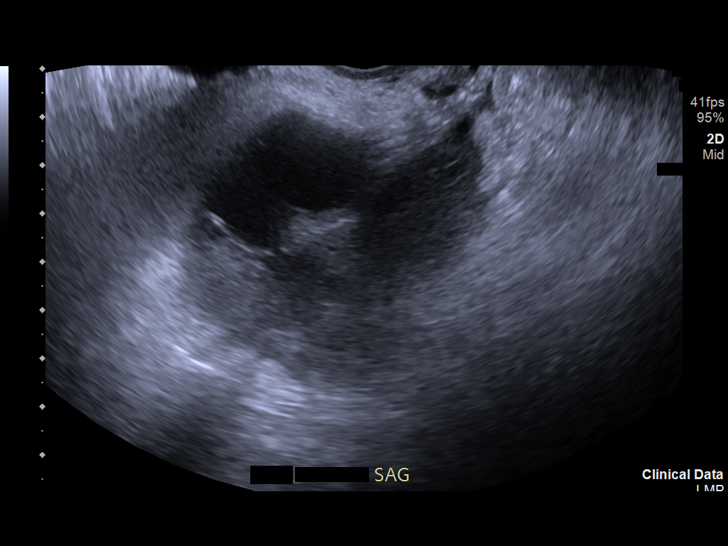
[im 74/89]
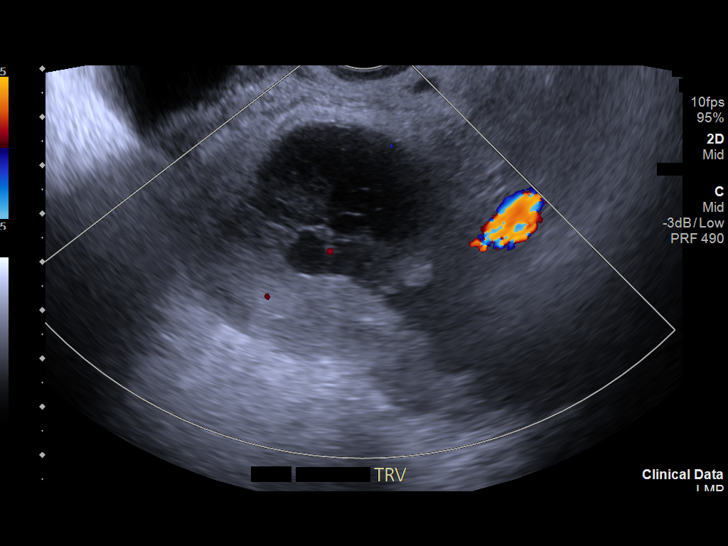
[im 81/89]
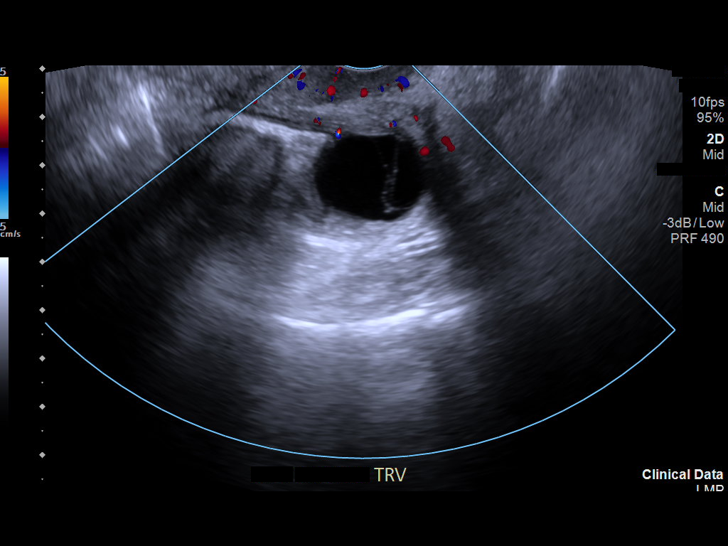
[im 89/89]
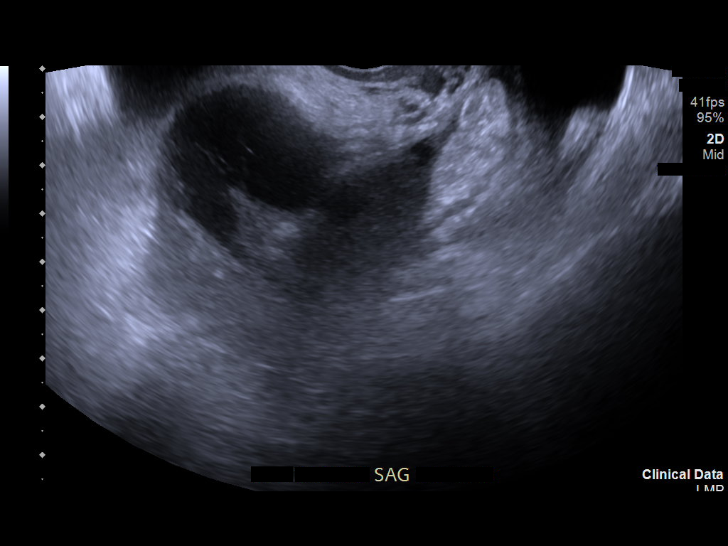

[13 of 25 positions shown; findings below may reference images not displayed]

FINDINGS: Uterus

Measurements: 8.9 x 5.2 x 6.1 cm = volume: 148 mL. No fibroids or
other mass visualized.

Endometrium

Thickness: 10 mm in thickness.  No focal abnormality visualized.

Right ovary

Measurements: 3.0 x 1.7 x 1.7 cm = volume: 4.5 mL. 2.8 x 1.5 x
cm complex cystic area noted in the right adnexa.

Left ovary

Measurements: 3.5 x 2.1 x 2.7 cm = volume: 10.2 mL. Multiple complex
cystic areas noted in the left adnexa including 4.5 x 2.9 x 2.2 cm
complex area lateral to the left ovary and 3.7 x 4.0 x 2.8 cm
complex area medial to the left ovary.

Other findings

No abnormal free fluid.
IMPRESSION: Multiple complex fluid collections in the pelvis, left adnexa
greater than right. These are in keeping and similar to prior CT
study and most compatible with pelvic inflammatory disease.
